# Patient Record
Sex: Female | Born: 1990 | Race: White | Hispanic: No | Marital: Married | Smoking: Former smoker
Health system: Southern US, Community
[De-identification: ages and names within clinical notes are randomized; demographics above are authoritative.]

## PROBLEM LIST (undated history)

## (undated) ENCOUNTER — Inpatient Hospital Stay: Payer: Self-pay

## (undated) DIAGNOSIS — E669 Obesity, unspecified: Secondary | ICD-10-CM

## (undated) DIAGNOSIS — E119 Type 2 diabetes mellitus without complications: Secondary | ICD-10-CM

## (undated) HISTORY — PX: CHOLECYSTECTOMY: SHX55

---
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
## 2011-03-05 ENCOUNTER — Emergency Department: Payer: Self-pay | Admitting: Internal Medicine

## 2012-01-27 ENCOUNTER — Emergency Department: Payer: Self-pay | Admitting: Emergency Medicine

## 2012-03-17 ENCOUNTER — Emergency Department: Payer: Self-pay | Admitting: Emergency Medicine

## 2012-03-17 LAB — CBC WITH DIFFERENTIAL/PLATELET
Eosinophil #: 0.1 10*3/uL (ref 0.0–0.7)
Eosinophil %: 2.4 %
Lymphocyte %: 33.1 %
Monocyte #: 0.5 x10 3/mm (ref 0.2–0.9)
Monocyte %: 11 %
Neutrophil #: 2.3 10*3/uL (ref 1.4–6.5)
RDW: 13.3 % (ref 11.5–14.5)

## 2012-03-17 LAB — COMPREHENSIVE METABOLIC PANEL
Albumin: 3.9 g/dL (ref 3.4–5.0)
Anion Gap: 7 (ref 7–16)
BUN: 10 mg/dL (ref 7–18)
Bilirubin,Total: 0.2 mg/dL (ref 0.2–1.0)
Calcium, Total: 8.7 mg/dL (ref 8.5–10.1)
Co2: 27 mmol/L (ref 21–32)
EGFR (Non-African Amer.): >60
Osmolality: 272 (ref 275–301)
Potassium: 3.3 mmol/L — ABNORMAL LOW (ref 3.5–5.1)
SGOT(AST): 41 U/L — ABNORMAL HIGH (ref 15–37)

## 2012-03-17 LAB — RAPID INFLUENZA A&B ANTIGENS

## 2012-03-17 LAB — URINALYSIS, COMPLETE
Bilirubin,UR: NEGATIVE
Nitrite: NEGATIVE
Ph: 5 (ref 4.5–8.0)
Protein: NEGATIVE
Specific Gravity: 1.029 (ref 1.003–1.030)
WBC UR: 1 /HPF (ref 0–5)

## 2012-03-17 LAB — LIPASE, BLOOD: Lipase: 84 U/L (ref 73–393)

## 2012-06-23 ENCOUNTER — Emergency Department (HOSPITAL_COMMUNITY): Payer: BC Managed Care – PPO

## 2012-06-23 ENCOUNTER — Emergency Department (HOSPITAL_COMMUNITY)
Admission: EM | Admit: 2012-06-23 | Discharge: 2012-06-24 | Disposition: A | Discharge status: Home / Self Care | Payer: BC Managed Care – PPO | Attending: Emergency Medicine | Admitting: Emergency Medicine

## 2012-06-23 ENCOUNTER — Encounter (HOSPITAL_COMMUNITY): Payer: Self-pay | Admitting: Emergency Medicine

## 2012-06-23 DIAGNOSIS — S6990XA Unspecified injury of unspecified wrist, hand and finger(s), initial encounter: Secondary | ICD-10-CM | POA: Insufficient documentation

## 2012-06-23 DIAGNOSIS — Y990 Civilian activity done for income or pay: Secondary | ICD-10-CM | POA: Insufficient documentation

## 2012-06-23 DIAGNOSIS — S60221A Contusion of right hand, initial encounter: Secondary | ICD-10-CM

## 2012-06-23 DIAGNOSIS — W208XXA Other cause of strike by thrown, projected or falling object, initial encounter: Secondary | ICD-10-CM | POA: Insufficient documentation

## 2012-06-23 DIAGNOSIS — Z87891 Personal history of nicotine dependence: Secondary | ICD-10-CM | POA: Insufficient documentation

## 2012-06-23 DIAGNOSIS — Y9289 Other specified places as the place of occurrence of the external cause: Secondary | ICD-10-CM | POA: Insufficient documentation

## 2012-06-23 DIAGNOSIS — Y9389 Activity, other specified: Secondary | ICD-10-CM | POA: Insufficient documentation

## 2012-06-23 DIAGNOSIS — S60229A Contusion of unspecified hand, initial encounter: Secondary | ICD-10-CM | POA: Insufficient documentation

## 2012-06-23 MED ORDER — OXYCODONE-ACETAMINOPHEN 5-325 MG PO TABS
2.0000 | ORAL_TABLET | Freq: Once | ORAL | Status: AC
  Administered 2012-06-24: 2 via ORAL
  Filled 2012-06-23: qty 2

## 2012-06-23 MED ORDER — NAPROXEN 375 MG PO TABS: 375.0000 mg | ORAL_TABLET | Freq: Two times a day (BID) | ORAL | Status: DC

## 2012-06-23 MED ORDER — HYDROCODONE-ACETAMINOPHEN 5-325 MG PO TABS: 1.0000 | ORAL_TABLET | Freq: Four times a day (QID) | ORAL | Status: DC | PRN

## 2012-06-23 NOTE — ED Notes (Signed)
PT. REPORTS INJURY TO RIGHT SHOULDER/RIGHT HAND  WHILE AT WORK ( [place])  THIS MORNING . SKIN INTACT , SWELLING / PAIN AT RIGHT HAND .

## 2012-06-23 NOTE — ED Notes (Signed)
Did not answer when called 

## 2012-06-23 NOTE — ED Provider Notes (Signed)
History     CSN: [id number]  Arrival date & time 06/23/12  2046   First MD Initiated Contact with Patient 06/23/12 2235      Chief Complaint  Patient presents with  . Shoulder Injury  . Hand Injury    (Consider location/radiation/quality/duration/timing/severity/associated sxs/prior treatment) HPI Comments: Patient is a 22 year old female in no significant past medical history who presents for right hand pain since this evening. Patient states that she was trying to lift a lawnmower when it landed on her right hand. Patient admits to swelling of her left hand as well as a throbbing pain sensation that radiates to her mid forearm. Pain is worse with movement and palpation and improved with rest. Patient denies fever, weakness, and numbness or tingling.  Patient is a 22 y.o. female presenting with shoulder injury and hand injury. The history is provided by the patient. No language interpreter was used.  Shoulder Injury Associated symptoms include arthralgias, joint swelling and myalgias.  Hand Injury   History reviewed. No pertinent past medical history.  History reviewed. No pertinent past surgical history.  No family history on file.  History  Substance Use Topics  . Smoking status: Former Games developer  . Smokeless tobacco: Not on file  . Alcohol Use: No    OB History   Grav Para Term Preterm Abortions TAB SAB Ect Mult Living                  Review of Systems  Musculoskeletal: Positive for myalgias, joint swelling and arthralgias.  All other systems reviewed and are negative.    Allergies  Cherry  Home Medications   Current Outpatient Rx  Name  Route  Sig  Dispense  Refill  . HYDROcodone-acetaminophen (NORCO/VICODIN) 5-325 MG per tablet   Oral   Take 1-2 tablets by mouth every 6 (six) hours as needed for pain.   15 tablet   0   . naproxen (NAPROSYN) 375 MG tablet   Oral   Take 1 tablet (375 mg total) by mouth 2 (two) times daily.   20 tablet   0     BP  130/74  Pulse 90  Temp(Src) 98.2 F (36.8 C) (Oral)  Resp 16  SpO2 98%  LMP 06/16/2012  Physical Exam  Nursing note and vitals reviewed. Constitutional: She is oriented to person, place, and time. She appears well-developed and well-nourished. No distress.  HENT:  Head: Normocephalic and atraumatic.  Eyes: Conjunctivae and EOM are normal. No scleral icterus.  Neck: Normal range of motion.  Cardiovascular: Normal rate, regular rhythm, normal heart sounds and intact distal pulses.   Pulmonary/Chest: Effort normal and breath sounds normal. No respiratory distress. She has no wheezes. She has no rales.  Musculoskeletal:       Right shoulder: She exhibits tenderness. She exhibits normal range of motion, no bony tenderness, no swelling, no crepitus, no deformity, no pain, normal pulse and normal strength.       Right hand: She exhibits tenderness, bony tenderness and swelling. She exhibits normal range of motion, normal two-point discrimination, normal capillary refill, no deformity and no laceration. Normal sensation noted. Normal strength noted.  TTP of 2nd-5th MCP and PIP joints of R hand. No bony deformities palpable. Capillary refill in RUE normal. DR pulses 2+ b/l.  Neurological: She is alert and oriented to person, place, and time.  No sensory or motor deficits appreciated  Skin: Skin is warm and dry. No rash noted. She is not diaphoretic. No erythema. No  pallor.  Soft tissue swelling and ecchymosis appreciated on the dorsal surface of the R hand  Psychiatric: She has a normal mood and affect. Her behavior is normal.    ED Course  Procedures (including critical care time)  Labs Reviewed - No data to display Dg Shoulder Right  06/23/2012  *RADIOLOGY REPORT*  Clinical Data: Shoulder injury.  The patient dislocated the right shoulder today but popped back in place.  Persistent pain. Dislocation is noted every 2-3 months.  RIGHT SHOULDER - 2+ VIEW  Comparison: None.  Findings: No  evidence of acute fracture or subluxation of the right shoulder.  Shallow Hill-Sachs deformity in the lateral humeral head.  No acute cortical step off is suggested.  Coracoclavicular and acromioclavicular spaces are maintained.  No focal bone lesion or bone destruction.  IMPRESSION: Probable Hill-Sachs deformity.  No acute fracture or subluxation demonstrated in the right shoulder.   Original Report Authenticated By: Burman Nieves, M.D.    Dg Hand Complete Right  06/23/2012  *RADIOLOGY REPORT*  Clinical Data: Pain, swelling, and bruising with decreased range of motion of the second and third digits.  RIGHT HAND - COMPLETE 3+ VIEW  Comparison: None.  Findings: Old fracture deformity versus chronic osteochondroma of the distal aspect proximal phalanx of the right second finger. Diffuse soft tissue swelling over the dorsum of the hand and fingers.  No evidence of acute fracture or subluxation.  No focal bone destruction or periosteal reaction.  No radiopaque soft tissue foreign bodies.  IMPRESSION: No acute bony abnormalities demonstrated.  Diffuse soft tissue swelling.   Original Report Authenticated By: Burman Nieves, M.D.      1. Contusion, hand, right, initial encounter      MDM  Contusion of R hand 2/2 lawn mower falling on hand at work. Patient neurovascularly intact with full ROM of RUE and hand. TTP of 2nd-5th MCP and PIP joints appreciated as well as muscle tenderness of R shoulder. Xray negative for bony fracture or dislocation. Probable Hill-Sachs deformity of R shoulder; discussed with patient. Patient stable for d/c with PCP follow up; RICE instruction provided as well as Naproxen for inflammation and Norco for severe pain. Indications for ED return discussed. Patient states comfort and understanding with this d/c plan with no unaddressed concerns.        Antony Madura, PA-C 06/27/12 [phone]

## 2012-06-24 NOTE — ED Notes (Signed)
Pt discharged.Vital signs stable and GCS 15 

## 2012-06-27 NOTE — ED Provider Notes (Signed)
Medical screening examination/treatment/procedure(s) were performed by non-physician practitioner and as supervising physician I was immediately available for consultation/collaboration.   Benny Lennert, MD 06/27/12 [phone]

## 2012-09-23 ENCOUNTER — Emergency Department: Payer: Self-pay | Admitting: Emergency Medicine

## 2012-09-28 ENCOUNTER — Ambulatory Visit: Payer: Self-pay | Admitting: Family Medicine

## 2013-07-06 IMAGING — CR DG CHEST 2V
1 series · 2 of 2 positions shown · non-contrast
Comparison: none

REASON FOR EXAM: cough
COMMENTS:

PROCEDURE:     DXR - DXR CHEST PA (OR AP) AND LATERAL  - March 17, 2012  [DATE]
RESULT:     The lungs are clear. The cardiac silhouette and visualized bony
skeleton are unremarkable.

[Series 1: w chest pa · 0.14mm/px · 2 of 2 slices shown]
[im 1/2]
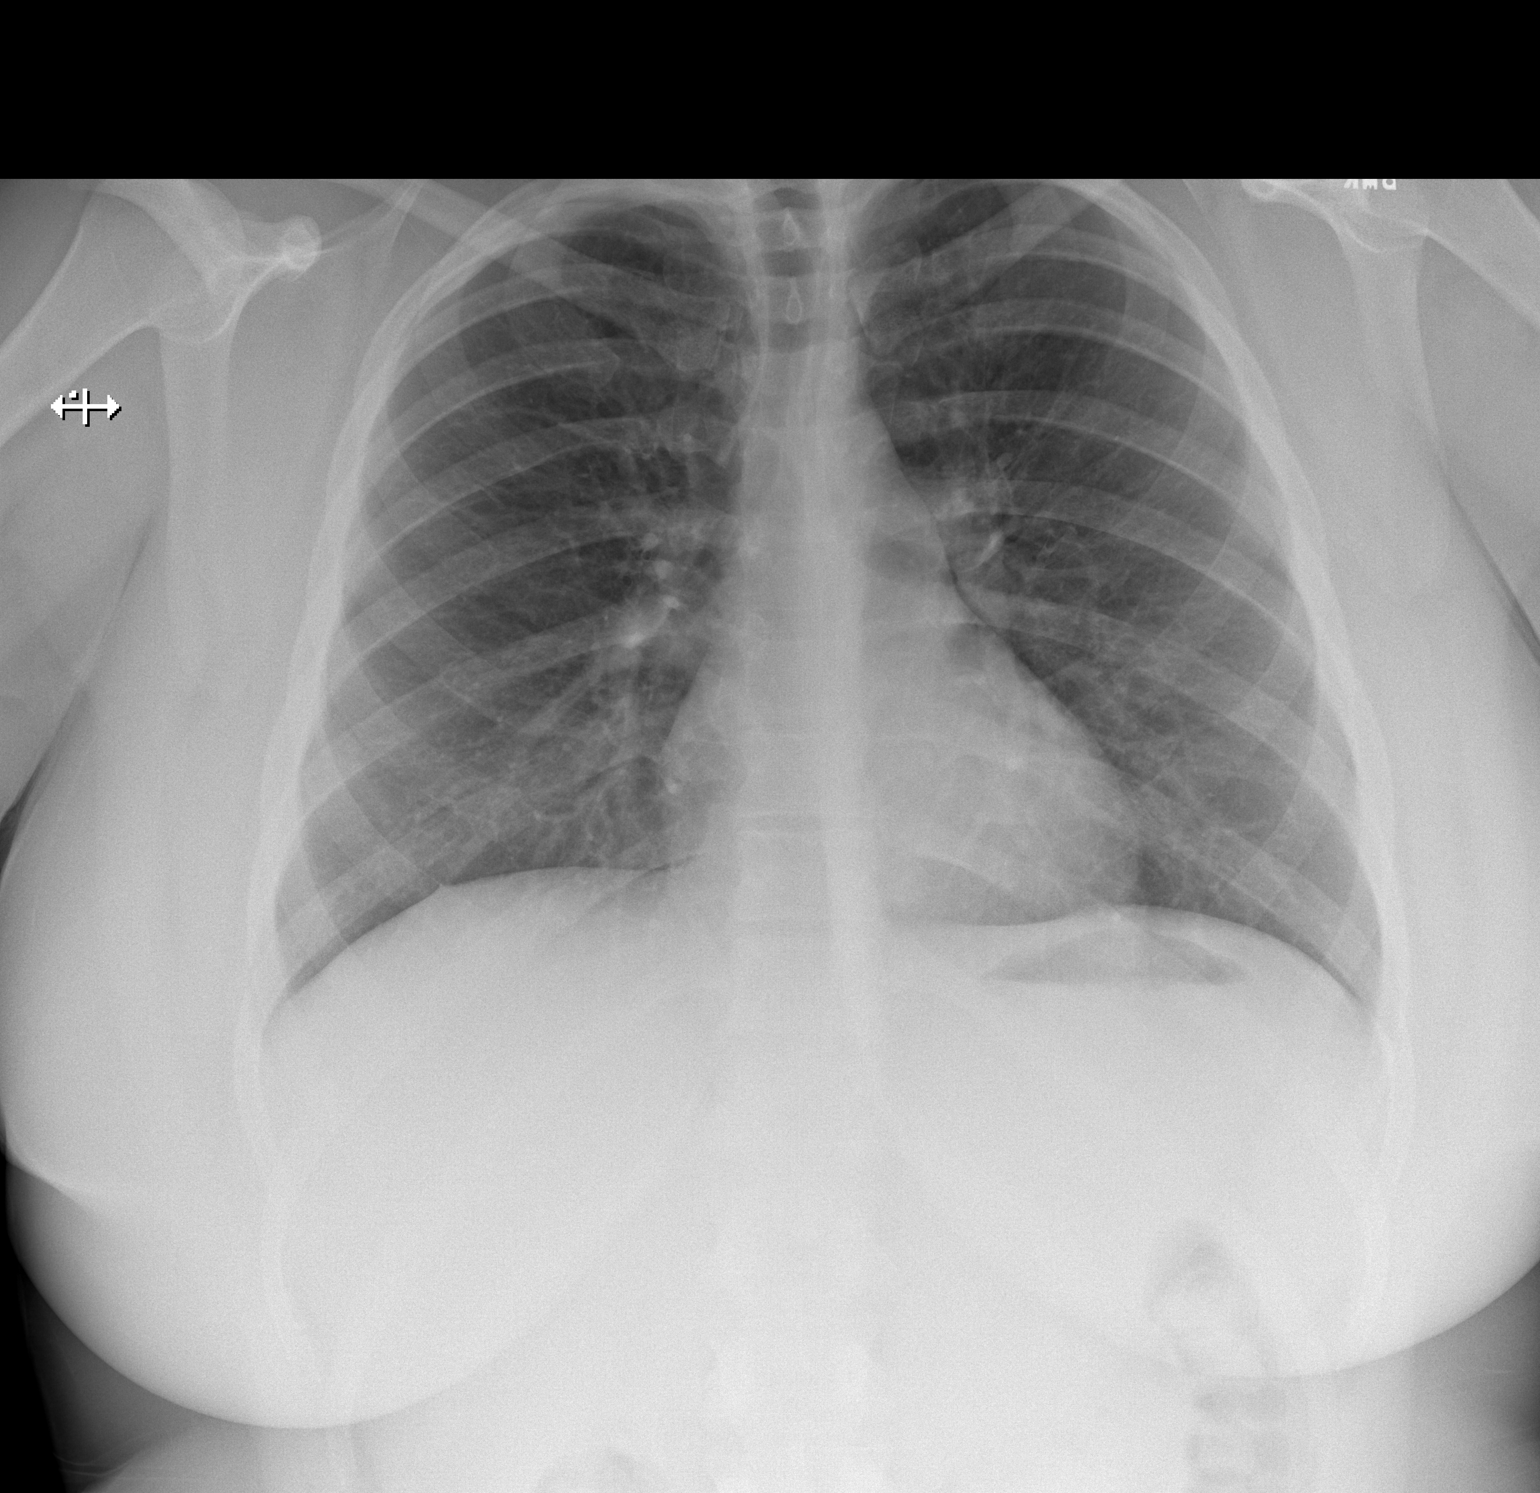
[im 2/2]
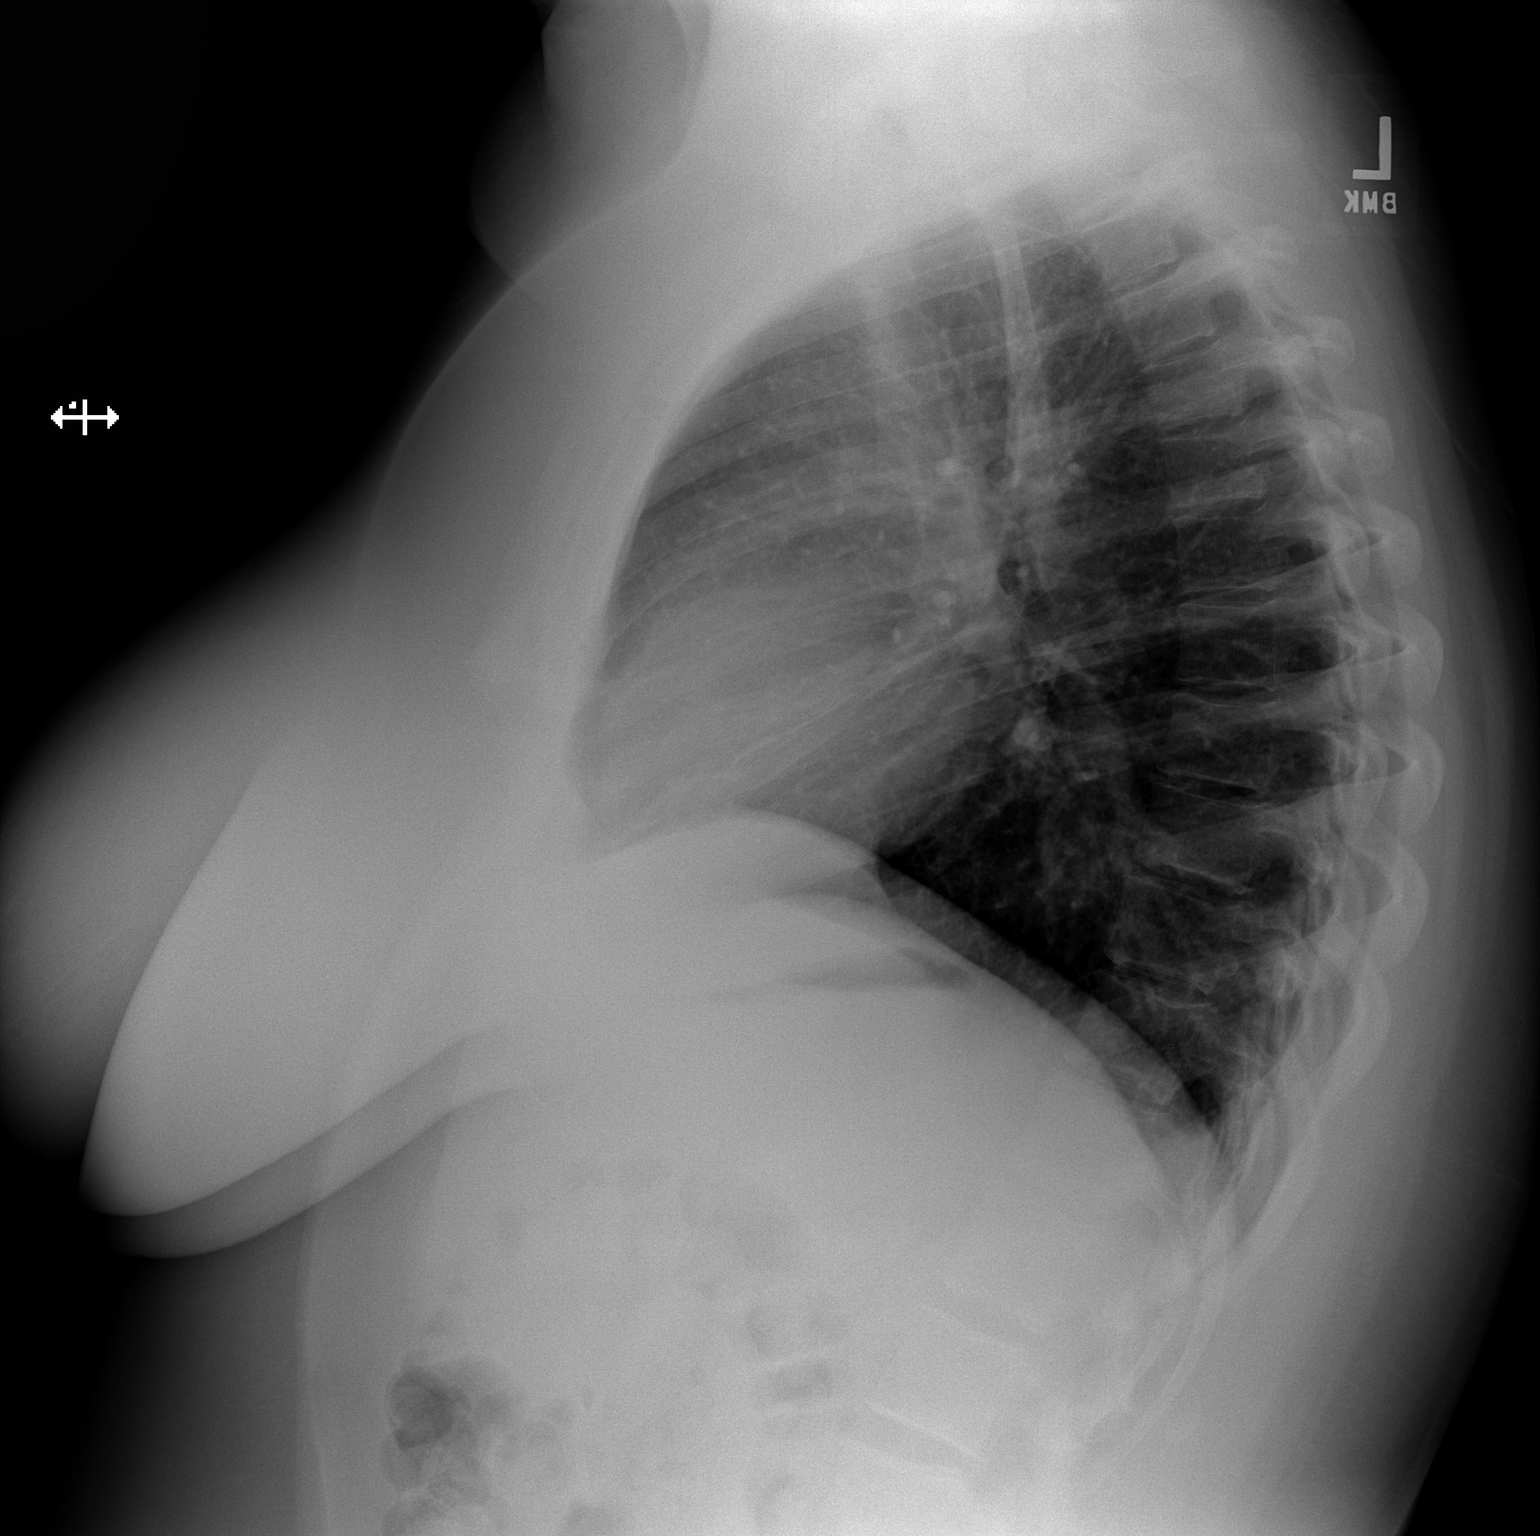

[2 of 2 positions shown; findings below may reference images not displayed]

IMPRESSION: 1. Chest radiograph without evidence of acute cardiopulmonary disease.

## 2014-10-12 ENCOUNTER — Encounter: Payer: Self-pay | Admitting: Emergency Medicine

## 2014-10-12 ENCOUNTER — Emergency Department
Admission: EM | Admit: 2014-10-12 | Discharge: 2014-10-12 | Disposition: A | Discharge status: Home / Self Care | Payer: Medicaid Other | Attending: Emergency Medicine | Admitting: Emergency Medicine

## 2014-10-12 ENCOUNTER — Emergency Department: Payer: Medicaid Other

## 2014-10-12 DIAGNOSIS — S299XXA Unspecified injury of thorax, initial encounter: Secondary | ICD-10-CM | POA: Insufficient documentation

## 2014-10-12 DIAGNOSIS — E119 Type 2 diabetes mellitus without complications: Secondary | ICD-10-CM | POA: Insufficient documentation

## 2014-10-12 DIAGNOSIS — Y998 Other external cause status: Secondary | ICD-10-CM | POA: Insufficient documentation

## 2014-10-12 DIAGNOSIS — F419 Anxiety disorder, unspecified: Secondary | ICD-10-CM | POA: Insufficient documentation

## 2014-10-12 DIAGNOSIS — Z87891 Personal history of nicotine dependence: Secondary | ICD-10-CM | POA: Insufficient documentation

## 2014-10-12 DIAGNOSIS — S27329A Contusion of lung, unspecified, initial encounter: Secondary | ICD-10-CM | POA: Insufficient documentation

## 2014-10-12 DIAGNOSIS — Y9389 Activity, other specified: Secondary | ICD-10-CM | POA: Insufficient documentation

## 2014-10-12 DIAGNOSIS — S3991XA Unspecified injury of abdomen, initial encounter: Secondary | ICD-10-CM | POA: Insufficient documentation

## 2014-10-12 DIAGNOSIS — Y92410 Unspecified street and highway as the place of occurrence of the external cause: Secondary | ICD-10-CM | POA: Insufficient documentation

## 2014-10-12 DIAGNOSIS — S0990XA Unspecified injury of head, initial encounter: Secondary | ICD-10-CM | POA: Insufficient documentation

## 2014-10-12 HISTORY — DX: Type 2 diabetes mellitus without complications: E11.9

## 2014-10-12 LAB — TYPE AND SCREEN
ABO/RH(D): A POS
Antibody Screen: NEGATIVE

## 2014-10-12 LAB — COMPREHENSIVE METABOLIC PANEL
ALT: 42 U/L (ref 14–54)
AST: 35 U/L (ref 15–41)
Albumin: 4.4 g/dL (ref 3.5–5.0)
Alkaline Phosphatase: 69 U/L (ref 38–126)
Anion gap: 9 (ref 5–15)
BUN: 12 mg/dL (ref 6–20)
CHLORIDE: 105 mmol/L (ref 101–111)
CO2: 23 mmol/L (ref 22–32)
CREATININE: 0.71 mg/dL (ref 0.44–1.00)
Calcium: 9.3 mg/dL (ref 8.9–10.3)
GFR calc Af Amer: >60 mL/min (ref >60)
GFR calc non Af Amer: >60 mL/min (ref >60)
Glucose, Bld: 129 mg/dL — ABNORMAL HIGH (ref 65–99)
Potassium: 3.8 mmol/L (ref 3.5–5.1)
SODIUM: 137 mmol/L (ref 135–145)
Total Bilirubin: <0.1 mg/dL — ABNORMAL LOW (ref 0.3–1.2)
Total Protein: 8.1 g/dL (ref 6.5–8.1)

## 2014-10-12 LAB — CBC
HEMATOCRIT: 40.4 % (ref 35.0–47.0)
HEMOGLOBIN: 13.2 g/dL (ref 12.0–16.0)
MCH: 28 pg (ref 26.0–34.0)
MCHC: 32.7 g/dL (ref 32.0–36.0)
MCV: 85.6 fL (ref 80.0–100.0)
Platelets: 415 10*3/uL (ref 150–440)
RBC: 4.72 MIL/uL (ref 3.80–5.20)
RDW: 13.3 % (ref 11.5–14.5)
WBC: 19.6 10*3/uL — AB (ref 3.6–11.0)

## 2014-10-12 LAB — ABO/RH: ABO/RH(D): A POS

## 2014-10-12 MED ORDER — IOHEXOL 350 MG/ML SOLN
100.0000 mL | Freq: Once | INTRAVENOUS | Status: AC | PRN
  Administered 2014-10-12: 100 mL via INTRAVENOUS

## 2014-10-12 MED ORDER — NAPROXEN 500 MG PO TABS: 500.0000 mg | ORAL_TABLET | Freq: Two times a day (BID) | ORAL | Status: DC
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
## 2014-11-05 ENCOUNTER — Ambulatory Visit: Payer: Medicaid Other

## 2016-04-11 DIAGNOSIS — E119 Type 2 diabetes mellitus without complications: Secondary | ICD-10-CM | POA: Insufficient documentation

## 2017-04-29 ENCOUNTER — Encounter: Payer: Self-pay | Admitting: Obstetrics and Gynecology

## 2018-12-19 ENCOUNTER — Other Ambulatory Visit: Payer: Self-pay

## 2018-12-19 DIAGNOSIS — Z87891 Personal history of nicotine dependence: Secondary | ICD-10-CM | POA: Insufficient documentation

## 2018-12-19 DIAGNOSIS — Z7984 Long term (current) use of oral hypoglycemic drugs: Secondary | ICD-10-CM | POA: Insufficient documentation

## 2018-12-19 DIAGNOSIS — E1165 Type 2 diabetes mellitus with hyperglycemia: Secondary | ICD-10-CM | POA: Insufficient documentation

## 2018-12-19 LAB — URINALYSIS, COMPLETE (UACMP) WITH MICROSCOPIC
Bacteria, UA: NONE SEEN
Bilirubin Urine: NEGATIVE
Glucose, UA: >=500 mg/dL — AB
Ketones, ur: NEGATIVE mg/dL
Leukocytes,Ua: NEGATIVE
Nitrite: NEGATIVE
Protein, ur: NEGATIVE mg/dL
Specific Gravity, Urine: 1.03 (ref 1.005–1.030)
pH: 6 (ref 5.0–8.0)

## 2018-12-19 LAB — BASIC METABOLIC PANEL
Anion gap: 11 (ref 5–15)
BUN: 17 mg/dL (ref 6–20)
CO2: 25 mmol/L (ref 22–32)
Calcium: 9.7 mg/dL (ref 8.9–10.3)
Chloride: 93 mmol/L — ABNORMAL LOW (ref 98–111)
Creatinine, Ser: 0.69 mg/dL (ref 0.44–1.00)
GFR calc Af Amer: >60 mL/min (ref >60)
GFR calc non Af Amer: >60 mL/min (ref >60)
Glucose, Bld: 547 mg/dL (ref 70–99)
Potassium: 3.9 mmol/L (ref 3.5–5.1)
Sodium: 129 mmol/L — ABNORMAL LOW (ref 135–145)

## 2018-12-19 LAB — CBC
HCT: 40.4 % (ref 36.0–46.0)
Hemoglobin: 13.6 g/dL (ref 12.0–15.0)
MCH: 28.5 pg (ref 26.0–34.0)
MCHC: 33.7 g/dL (ref 30.0–36.0)
MCV: 84.5 fL (ref 80.0–100.0)
Platelets: 335 10*3/uL (ref 150–400)
RBC: 4.78 MIL/uL (ref 3.87–5.11)
RDW: 12 % (ref 11.5–15.5)
WBC: 9.7 10*3/uL (ref 4.0–10.5)
nRBC: 0 % (ref 0.0–0.2)

## 2018-12-19 LAB — GLUCOSE, CAPILLARY: Glucose-Capillary: 579 mg/dL (ref 70–99)

## 2018-12-19 LAB — POCT PREGNANCY, URINE: Preg Test, Ur: NEGATIVE

## 2018-12-19 MED ORDER — SODIUM CHLORIDE 0.9 % IV SOLN
Freq: Once | INTRAVENOUS | Status: AC
  Administered 2018-12-19: 21:00:00 via INTRAVENOUS

## 2018-12-19 NOTE — ED Triage Notes (Signed)
Pt comes via POV from home with c/o high BS.  Pt states she felt dizzy earlier and almost fainted in the bathroom. Pt states her mom checked her BS and it read HIGH.  Pt states dx with Type 2 diabetes and took medication for a year. Pt states she then stopped taking it after being told she didn't have it.  Pt states she took 1 metformin from her mother.  Current BS-Reading HIGH

## 2018-12-20 ENCOUNTER — Emergency Department
Admission: EM | Admit: 2018-12-20 | Discharge: 2018-12-20 | Disposition: A | Discharge status: Home / Self Care | Payer: No Typology Code available for payment source | Attending: Emergency Medicine | Admitting: Emergency Medicine

## 2018-12-20 ENCOUNTER — Encounter: Payer: Self-pay | Admitting: Emergency Medicine

## 2018-12-20 DIAGNOSIS — E1165 Type 2 diabetes mellitus with hyperglycemia: Secondary | ICD-10-CM

## 2018-12-20 HISTORY — DX: Obesity, unspecified: E66.9

## 2018-12-20 LAB — GLUCOSE, CAPILLARY: Glucose-Capillary: 294 mg/dL — ABNORMAL HIGH (ref 70–99)

## 2018-12-20 LAB — HEMOGLOBIN A1C
Hgb A1c MFr Bld: 10.5 % — ABNORMAL HIGH (ref 4.8–5.6)
Mean Plasma Glucose: 254.65 mg/dL

## 2018-12-20 MED ORDER — ONDANSETRON HCL 4 MG PO TABS: 4.0000 mg | ORAL_TABLET | Freq: Three times a day (TID) | ORAL | 0 refills | Status: AC | PRN

## 2018-12-20 MED ORDER — METFORMIN HCL 1000 MG PO TABS: 1000.0000 mg | ORAL_TABLET | Freq: Two times a day (BID) | ORAL | 11 refills | Status: DC

## 2018-12-20 NOTE — Discharge Instructions (Signed)
As we discussed, though your blood sugar is running high, it is not dangerous at this time.  However I do think it is important you start back on medication.  I sent a blood test called a hemoglobin A1c which is a way of measuring the severity of your diabetes.  You can review the information in this paperwork about how to set up a MyChart account so that you can see the result and more importantly, you can share it with a primary care doctor.  [facility] to [facility] the next available appointment to establish care with a doctor if you do not have contact with your previous doctor.  Please take the medication as prescribed and try to avoid carbohydrates as much as possible.  Read through the included information for additional details about diabetes management.  Return to the emergency department if you develop new or worsening symptoms that concern you.

## 2018-12-20 NOTE — ED Notes (Signed)
Pt st that she was dx with Type 2 DM 3 years ago and was prescribed Metformin. Pt reports she was told a year ago that she no longer needed to take medication for her DM/;.Today pt reports being more confused today and her mother encouraged pt to check her CBG/ Pt st her CBG was >600 on her mothers machine and pt took 1 tab of Metformin 1000mg .   Pt st that over the course of the last month she's "felt like her sugar has been up and has had visual changes" but denies taking any medication or checking her CBG prior to today. Pt reports be under an increased amount of stress over the last month and has since "been eating a lot more junk food".   Pt also reports GI pain and upset for the last year that "makes her feel like her stomach's being stabbed"/ and it only happens after eating a large meal after 5pm /popcorn

## 2018-12-20 NOTE — ED Provider Notes (Signed)
[facility] Emergency Department Provider Note  ____________________________________________   First MD Initiated Contact with Patient 12/20/18 0112     (approximate)  I have reviewed the triage vital signs and the nursing notes.   HISTORY  Chief Complaint Hyperglycemia    HPI Kristin Evans is a 28 y.o. female with medical history as listed below who has been off of Metformin for about a year because she was told by her primary care doctor at the time that she no longer needed it and that she does not have diabetes.  She presents tonight for blood sugar reading too high to measure on home monitor.  She said that although she has been off of her medicine for about a year, it has been about a month that she has had gradually worsening symptoms, which include occasional dizziness, increased thirst, increased urination, etc.  She feels similar to the way she did in the past when she had to start taking Metformin which she says she took twice a day, 1000 mg with meals.  She has not had any contact with COVID-19 patients.  She says that she previously lost 115 pounds but is put back on 20 to 25 pounds.  She denies fever/chills, sore throat, chest pain, cough.  She has had occasional nausea and vomiting.  No diarrhea.  No vaginal complaints or concerns.  No dysuria nor hematuria.       Past Medical History:  Diagnosis Date  . Diabetes mellitus without complication ([place])   . Obesity     There are no active problems to display for this patient.   History reviewed. No pertinent surgical history.  Prior to Admission medications   Medication Sig Start Date End Date Taking? Authorizing Provider  metFORMIN (GLUCOPHAGE) 1000 MG tablet Take 1 tablet (1,000 mg total) by mouth 2 (two) times daily with a meal. 12/20/18 12/20/19  Hinda Kehr, MD  ondansetron (ZOFRAN) 4 MG tablet Take 1 tablet (4 mg total) by mouth every 8 (eight) hours as needed for nausea or  vomiting. 12/20/18 12/20/19  Hinda Kehr, MD    Allergies Cherry  History reviewed. No pertinent family history.  Social History Social History   Tobacco Use  . Smoking status: Former Research scientist (life sciences)  . Smokeless tobacco: Never Used  Substance Use Topics  . Alcohol use: No  . Drug use: No    Review of Systems Constitutional: No fever/chills.  Increased thirst.  Occasional dizziness. Eyes: No visual changes. ENT: No sore throat. Cardiovascular: Denies chest pain. Respiratory: Denies shortness of breath. Gastrointestinal: No abdominal pain.  Intermittent nausea and vomiting.  No diarrhea.  No constipation. Genitourinary: Negative for dysuria.  Increased urinary frequency. Musculoskeletal: Negative for neck pain.  Negative for back pain. Integumentary: Negative for rash. Neurological: Negative for headaches, focal weakness or numbness.   ____________________________________________   PHYSICAL EXAM:  VITAL SIGNS: ED Triage Vitals  Enc Vitals Group     BP 12/19/18 2109 (!) 142/84     Pulse Rate 12/19/18 2109 87     Resp 12/19/18 2109 18     Temp 12/19/18 2109 98.5 F (36.9 C)     Temp src --      SpO2 12/19/18 2109 100 %     Weight 12/19/18 2110 123.8 kg (273 lb)     Height 12/19/18 2110 1.6 m (5\' 3" )     Head Circumference --      Peak Flow --      Pain Score  12/19/18 2109 5     Pain Loc --      Pain Edu? --      Excl. in GC? --     Constitutional: Alert and oriented.  Well-appearing and in no acute distress. Eyes: Conjunctivae are normal.  Head: Atraumatic. Nose: No congestion/rhinnorhea. Mouth/Throat: Patient is wearing a mask. Neck: No stridor.  No meningeal signs.   Cardiovascular: Normal rate, regular rhythm. Good peripheral circulation. Grossly normal heart sounds. Respiratory: Normal respiratory effort.  No retractions. Gastrointestinal: Soft and nontender. No distention.  Musculoskeletal: No lower extremity tenderness nor edema. No gross deformities of  extremities. Neurologic:  Normal speech and language. No gross focal neurologic deficits are appreciated.  Skin:  Skin is warm, dry and intact. Psychiatric: Mood and affect are normal. Speech and behavior are normal.  ____________________________________________   LABS (all labs ordered are listed, but only abnormal results are displayed)  Labs Reviewed  GLUCOSE, CAPILLARY - Abnormal; Notable for the following components:      Result Value   Glucose-Capillary >600 (*)    All other components within normal limits  BASIC METABOLIC PANEL - Abnormal; Notable for the following components:   Sodium 129 (*)    Chloride 93 (*)    Glucose, Bld 547 (*)    All other components within normal limits  URINALYSIS, COMPLETE (UACMP) WITH MICROSCOPIC - Abnormal; Notable for the following components:   Color, Urine COLORLESS (*)    APPearance CLEAR (*)    Glucose, UA >=500 (*)    Hgb urine dipstick SMALL (*)    All other components within normal limits  GLUCOSE, CAPILLARY - Abnormal; Notable for the following components:   Glucose-Capillary 579 (*)    All other components within normal limits  GLUCOSE, CAPILLARY - Abnormal; Notable for the following components:   Glucose-Capillary 294 (*)    All other components within normal limits  CBC  HEMOGLOBIN A1C  CBG MONITORING, ED  POC URINE PREG, ED  POCT PREGNANCY, URINE  CBG MONITORING, ED   ____________________________________________  EKG  None - EKG not ordered by ED physician ____________________________________________  RADIOLOGY I, Loleta Rose, personally viewed and evaluated these images (plain radiographs) as part of my medical decision making, as well as reviewing the written report by the radiologist.  ED MD interpretation: No indication for emergent imaging  Official radiology report(s): No results found.  ____________________________________________   PROCEDURES   Procedure(s) performed (including Critical Care):   Procedures   ____________________________________________   INITIAL IMPRESSION / MDM / ASSESSMENT AND PLAN / ED COURSE  As part of my medical decision making, I reviewed the following data within the electronic MEDICAL RECORD NUMBER Nursing notes reviewed and incorporated, Labs reviewed , Old chart reviewed and Notes from prior ED visits   Differential diagnosis includes, but is not limited to, uncontrolled diabetes, DKA, HHNS, acute infection.  The patient's lab work is reassuring; other than an initial glucose of 547, and an associated pseudohyponatremia of 129, her lab work is reassuring with no evidence of acute infection and normal kidney function and a normal anion gap.  Since the patient does not currently have a primary care doctor I am sending a hemoglobin A1c and explained how she can check it in my chart.  [facility]  She took a leftover Metformin at home so we will not give her dose tonight but after we discussed the appropriate plan, I am starting her on Metformin 1000 mg twice  daily with meals and she says that she will [facility].  She knows she may have some gastrointestinal side effects and I have also written a prescription for Zofran and I gave her a [employer] coupon card.  I gave written information about diabetes and nutrition and encouraged her to avoid carbohydrates.  I gave my usual and customary return precautions.          ____________________________________________  FINAL CLINICAL IMPRESSION(S) / ED DIAGNOSES  Final diagnoses:  Type 2 diabetes mellitus with hyperglycemia, without long-term current use of insulin (HCC)     MEDICATIONS GIVEN DURING THIS VISIT:  Medications  0.9 %  sodium chloride infusion ( Intravenous Stopped 12/20/18 0111)     ED Discharge Orders         Ordered    metFORMIN (GLUCOPHAGE) 1000 MG tablet  2 times daily with meals     12/20/18 0148    ondansetron  (ZOFRAN) 4 MG tablet  Every 8 hours PRN     12/20/18 0148          *Please note:  Joesph JulyErica N Newell Kulik was evaluated in Emergency Department on 12/20/2018 for the symptoms described in the history of present illness. She was evaluated in the context of the global COVID-19 pandemic, which necessitated consideration that the patient might be at risk for infection with the SARS-CoV-2 virus that causes COVID-19. Institutional protocols and algorithms that pertain to the evaluation of patients at risk for COVID-19 are in a state of rapid change based on information released by regulatory bodies including the CDC and federal and state organizations. These policies and algorithms were followed during the patient's care in the ED.  Some ED evaluations and interventions may be delayed as a result of limited staffing during the pandemic.*  Note:  This document was prepared using Dragon voice recognition software and may include unintentional dictation errors.   Loleta RoseForbach, Matin Mattioli, MD 12/20/18 [phone]

## 2018-12-20 NOTE — ED Notes (Signed)
Pt unable to sign electronic signature due to computer issue. A hard copy was signed and placed for pickup by medical records.  Pt gathered all personal belongings from room and removed them prior to ED departure.

## 2018-12-22 LAB — GLUCOSE, CAPILLARY: Glucose-Capillary: >600 mg/dL (ref 70–99)

## 2019-01-23 ENCOUNTER — Other Ambulatory Visit: Payer: Self-pay

## 2019-01-23 DIAGNOSIS — Z20822 Contact with and (suspected) exposure to covid-19: Secondary | ICD-10-CM

## 2019-01-24 ENCOUNTER — Telehealth: Payer: Self-pay | Admitting: *Deleted

## 2019-01-24 NOTE — Telephone Encounter (Signed)
Pt called to check on her covid-19 test result. No result yet. Pt voiced understanding.

## 2019-01-25 LAB — NOVEL CORONAVIRUS, NAA: SARS-CoV-2, NAA: DETECTED — AB

## 2019-01-27 ENCOUNTER — Telehealth: Payer: Self-pay | Admitting: Nurse Practitioner

## 2019-01-27 NOTE — Telephone Encounter (Signed)
Called to Discuss with patient about Covid symptoms and the use of bamlanivimab, a monoclonal antibody infusion for those with mild to moderate Covid symptoms and at a high risk of hospitalization.     Pt is qualified for this infusion at the [facility] due to co-morbid conditions and/or a member of an at-risk group.    Patient is currently being managed for diabetes   Unable to reach pt  

## 2019-03-14 ENCOUNTER — Other Ambulatory Visit: Payer: Self-pay

## 2019-03-14 ENCOUNTER — Ambulatory Visit: Payer: Self-pay | Attending: Family

## 2019-03-14 DIAGNOSIS — Z20822 Contact with and (suspected) exposure to covid-19: Secondary | ICD-10-CM | POA: Insufficient documentation

## 2019-03-15 LAB — NOVEL CORONAVIRUS, NAA: SARS-CoV-2, NAA: NOT DETECTED

## 2019-08-30 ENCOUNTER — Telehealth: Payer: Self-pay | Admitting: General Practice

## 2019-08-30 NOTE — Telephone Encounter (Signed)
Individual has been contacted regarding the ED referral and has stated she is not interested in becoming a pt. No further attempts to contact individual will be made.

## 2019-11-08 DIAGNOSIS — O0993 Supervision of high risk pregnancy, unspecified, third trimester: Secondary | ICD-10-CM

## 2020-02-19 ENCOUNTER — Other Ambulatory Visit: Payer: Self-pay

## 2020-02-19 ENCOUNTER — Observation Stay
Admission: EM | Admit: 2020-02-19 | Discharge: 2020-02-19 | Disposition: A | Discharge status: Home / Self Care | Payer: Medicaid Other

## 2020-02-19 DIAGNOSIS — Z7984 Long term (current) use of oral hypoglycemic drugs: Secondary | ICD-10-CM | POA: Insufficient documentation

## 2020-02-19 DIAGNOSIS — O99512 Diseases of the respiratory system complicating pregnancy, second trimester: Principal | ICD-10-CM | POA: Insufficient documentation

## 2020-02-19 DIAGNOSIS — Z7982 Long term (current) use of aspirin: Secondary | ICD-10-CM | POA: Diagnosis not present

## 2020-02-19 DIAGNOSIS — E119 Type 2 diabetes mellitus without complications: Secondary | ICD-10-CM | POA: Diagnosis not present

## 2020-02-19 DIAGNOSIS — Z87891 Personal history of nicotine dependence: Secondary | ICD-10-CM | POA: Insufficient documentation

## 2020-02-19 DIAGNOSIS — J069 Acute upper respiratory infection, unspecified: Secondary | ICD-10-CM | POA: Diagnosis not present

## 2020-02-19 DIAGNOSIS — O368130 Decreased fetal movements, third trimester, not applicable or unspecified: Secondary | ICD-10-CM | POA: Diagnosis present

## 2020-02-19 DIAGNOSIS — O368120 Decreased fetal movements, second trimester, not applicable or unspecified: Secondary | ICD-10-CM | POA: Diagnosis present

## 2020-02-19 DIAGNOSIS — Z3A26 26 weeks gestation of pregnancy: Secondary | ICD-10-CM | POA: Diagnosis not present

## 2020-02-19 LAB — URINALYSIS, COMPLETE (UACMP) WITH MICROSCOPIC
Bilirubin Urine: NEGATIVE
Glucose, UA: >=500 mg/dL — AB
Hgb urine dipstick: NEGATIVE
Ketones, ur: NEGATIVE mg/dL
Leukocytes,Ua: NEGATIVE
Nitrite: NEGATIVE
Protein, ur: NEGATIVE mg/dL
Specific Gravity, Urine: 1.016 (ref 1.005–1.030)
pH: 7 (ref 5.0–8.0)

## 2020-02-19 MED ORDER — ACETAMINOPHEN 325 MG PO TABS: 650.0000 mg | ORAL_TABLET | ORAL | Status: AC | PRN

## 2020-02-19 MED ORDER — BENZONATATE 100 MG PO CAPS: 100.0000 mg | ORAL_CAPSULE | Freq: Three times a day (TID) | ORAL | 0 refills | Status: AC | PRN

## 2020-02-19 MED ORDER — AZITHROMYCIN 250 MG PO TABS: ORAL_TABLET | ORAL | 0 refills | Status: AC

## 2020-02-19 MED ORDER — CALCIUM CARBONATE ANTACID 500 MG PO CHEW: 2.0000 | CHEWABLE_TABLET | ORAL | Status: DC | PRN

## 2020-02-19 MED ORDER — GUAIFENESIN-DM 100-10 MG/5ML PO SYRP: 5.0000 mL | ORAL_SOLUTION | ORAL | 0 refills | Status: DC | PRN

## 2020-02-19 MED ORDER — ACETAMINOPHEN 325 MG PO TABS: 650.0000 mg | ORAL_TABLET | ORAL | Status: DC | PRN

## 2020-02-19 MED ORDER — ALBUTEROL SULFATE HFA 108 (90 BASE) MCG/ACT IN AERS: 2.0000 | INHALATION_SPRAY | Freq: Four times a day (QID) | RESPIRATORY_TRACT | 2 refills | Status: DC | PRN

## 2020-02-19 NOTE — OB Triage Note (Addendum)
Pt Kristin Evans 29 y.o. presents to the ED complaining of decreased fetal movement . Pt is a G1P0 at [redacted]w[redacted]d . Pt denies signs and symptons consistent with rupture of membranes or active vaginal bleeding. Pt denies contractions but reports lower abdominal cramping rating 6/10 on the pain scale. Pt also reports 8/10 constant headache that gets worse when she coughs. Pt reports cough, fever, chills, fatigue, body aches, nasal congestion, sore throat. Pt went today and was tested, resulted covid and flu negative.  External FM and TOCO applied to non-tender abdomen and assessing. Initial FHR 155 . Vital signs obtained and within normal limits. Provider notified of pt.

## 2020-02-19 NOTE — Progress Notes (Signed)
Pt discharged home per A.Mackie,CNM order.  Pt stable and ambulatory. An After Visit Summary was printed and given to the patient. Discharge education completed with patient/family including follow up instructions, medication list, d/c activities limitations if indicated, with other d/c instructions as indicated by CNM .Prescriptions sent to the pharmacy on file. Pt received labor and bleeding precautions. Patient able to verbalize understanding, all questions fully answered. Patient instructed to return to ED, call 911, or call MD for any changes in condition. Pt discharged home via personal vehicle with support person.

## 2020-02-19 NOTE — Discharge Summary (Signed)
Kristin Evans Record is a 29 y.o. female. She is at [redacted]w[redacted]d gestation. No LMP recorded. Patient is pregnant. Estimated Date of Delivery: 05/27/20  Prenatal care site: [facility]  Chief complaint: decreased fetal movement for 2 days   Kristin Evans presents today with complaints of decreased fetal movement for 2 days.  She reports that she's had worsening cough, congestion, headache, sinus pressure, fatigue, fever, chills, body aches, and wheezing for about a week.  States that fetal movement was decreased over the past 2 days since she's been feeling worse.  She was screened for covid and flu today and was negative.  Reports that her head hurts significantly when she starts a coughing fit.  Has tried robitussin and mucinex and they have not been helpful.    S: Resting in bed, intermittently coughing, no CTX, no VB.no LOF,  feeling baby move more now.    Maternal Medical History:  Past Medical Hx:  has a past medical history of Diabetes mellitus without complication (HCC) and Obesity.    Past Surgical Hx:  has no past surgical history on file.   No Active Allergies   Prior to Admission medications   Medication Sig Start Date End Date Taking? Authorizing Provider  albuterol (VENTOLIN HFA) 108 (90 Base) MCG/ACT inhaler Inhale 2 puffs into the lungs every 6 (six) hours as needed for wheezing or shortness of breath. 02/19/20  Yes Gustavo Lah, CNM  aspirin 81 MG chewable tablet Chew by mouth daily.   Yes [provider]  azithromycin (ZITHROMAX Z-PAK) 250 MG tablet Take 2 tablets (500 mg) on  Day 1,  followed by 1 tablet (250 mg) once daily on Days 2 through 5. 02/19/20 02/24/20 Yes Gustavo Lah, CNM  benzonatate (TESSALON PERLES) 100 MG capsule Take 1 capsule (100 mg total) by mouth 3 (three) times daily as needed for up to 10 days for cough. 02/19/20 02/29/20 Yes Gustavo Lah, CNM  guaiFENesin-dextromethorphan (ROBITUSSIN DM) 100-10 MG/5ML syrup Take 5 mLs by mouth every 4 (four)  hours as needed for cough. 02/19/20  Yes Gustavo Lah, CNM  Prenatal Vit-Fe Fumarate-FA (MULTIVITAMIN-PRENATAL) 27-0.8 MG TABS tablet Take 1 tablet by mouth daily at 12 noon.   Yes [provider]  acetaminophen (TYLENOL) 325 MG tablet Take 2 tablets (650 mg total) by mouth every 4 (four) hours as needed (for pain scale < 4  OR  temperature  >/=  100.5 F). 02/19/20   Gustavo Lah, CNM  metFORMIN (GLUCOPHAGE) 1000 MG tablet Take 1 tablet (1,000 mg total) by mouth 2 (two) times daily with a meal. 12/20/18 12/20/19  Loleta Rose, MD    Social History: She  reports that she has quit smoking. She has never used smokeless tobacco. She reports that she does not drink alcohol and does not use drugs.  Family History: No pertinent family history,no history of gyn cancers  Review of Systems: A full review of systems was performed and negative except as noted in the HPI.    O:  BP 129/65 (BP Location: Left Arm)   Pulse (!) 121   Temp 98.3 F (36.8 C) (Oral)   Resp 18   Ht 5\' 3"  (1.6 m)   Wt 115.4 kg   SpO2 95%   BMI 45.06 kg/m  Results for orders placed or performed during the hospital encounter of 02/19/20 (from the past 48 hour(s))  Urinalysis, Complete w Microscopic Urine, Clean Catch   Collection Time: 02/19/20  4:26 PM  Result Value Ref Range   Color, Urine YELLOW (A) YELLOW   APPearance HAZY (A) CLEAR   Specific Gravity, Urine 1.016 1.005 - 1.030   pH 7.0 5.0 - 8.0   Glucose, UA >=500 (A) NEGATIVE mg/dL   Hgb urine dipstick NEGATIVE NEGATIVE   Bilirubin Urine NEGATIVE NEGATIVE   Ketones, ur NEGATIVE NEGATIVE mg/dL   Protein, ur NEGATIVE NEGATIVE mg/dL   Nitrite NEGATIVE NEGATIVE   Leukocytes,Ua NEGATIVE NEGATIVE   RBC / HPF 0-5 0 - 5 RBC/hpf   WBC, UA 0-5 0 - 5 WBC/hpf   Bacteria, UA RARE (A) NONE SEEN   Squamous Epithelial / LPF 0-5 0 - 5   Mucus PRESENT      Constitutional: NAD, AAOx3  HE/ENT: extraocular movements grossly intact, moist mucous membranes CV:  RRR, equal, intermittent wheezing auscultated, rhonchi present - clears with cough  PULM: nl respiratory effort, CTABL     Abd: gravid, non-tender, non-distended, soft      Ext: Non-tender, Nonedmeatous   Psych: mood appropriate, speech normal Pelvic : deferred  Fetal Monitor: Baseline: 150 bpm, Variability: moderate, Accelerations: >2 10x10 and Decelerations: Absent  Toco: quiet   Category: 1 Appropriate for gestational age    Assessment: 29 y.o. [redacted]w[redacted]d here for antenatal surveillance during pregnancy.  Principle diagnosis: Acute upper respiratory infection in pregnancy   Plan:  Labor: not present.   Fetal Wellbeing: Reassuring Cat 1 tracing.  Reactive NST - appropriate for gestational age   Intermittent wheezing and rhonchi present   Discussed that most respiratory illnesses are viral.  However, given that she is pregnant and has diabetes will treat with azithromycin d/t worsening symptoms over the past week.    Rx for albuterol inhaler given, reviewed appropriate use   Rx for benzonatate for cough suppressant - Robitussin and mucinex have not been effective   Comfort measures reviewed.  Encouraged hydration.    Warning signs reviewed.  Instructed to return for worsening symptoms.    D/c home stable, precautions reviewed, follow-up as scheduled.   ----- Margaretmary Eddy, CNM Certified Nurse Midwife [facility]

## 2020-02-19 NOTE — Discharge Instructions (Signed)
May take sudafed as needed for congestion Robitussin DM as needed for cough and congestion  Be sure to drink plenty of fluids    Acute Bronchitis, Adult  Acute bronchitis is when air tubes in the lungs (bronchi) suddenly get swollen. The condition can make it hard for you to breathe. In adults, acute bronchitis usually goes away within 2 weeks. A cough caused by bronchitis may last up to 3 weeks. Smoking, allergies, and asthma can make the condition worse. What are the causes? This condition is caused by:  Cold and flu viruses. The most common cause of this condition is the virus that causes the common cold.  Bacteria.  Substances that irritate the lungs, including: ? Smoke from cigarettes and other types of tobacco. ? Dust and pollen. ? Fumes from chemicals, gases, or burned fuel. ? Other materials that pollute indoor or outdoor air.  Close contact with someone who has acute bronchitis. What increases the risk? The following factors may make you more likely to develop this condition:  A weak body's defense system. This is also called the immune system.  Any condition that affects your lungs and breathing, such as asthma. What are the signs or symptoms? Symptoms of this condition include:  A cough.  Coughing up clear, yellow, or green mucus.  Wheezing.  Chest congestion.  Shortness of breath.  A fever.  Body aches.  Chills.  A sore throat. How is this treated? Acute bronchitis may go away over time without treatment. Your doctor may recommend:  Drinking more fluids.  Taking a medicine for a fever or cough.  Using a device that gets medicine into your lungs (inhaler).  Using a vaporizer or a humidifier. These are machines that add water or moisture in the air to help with coughing and poor breathing. Follow these instructions at home:  Activity  Get a lot of rest.  Avoid places where there are fumes from chemicals.  Return to your normal activities as  told by your doctor. Ask your doctor what activities are safe for you. Lifestyle  Drink enough fluids to keep your pee (urine) pale yellow.  Do not drink alcohol.  Do not use any products that contain nicotine or tobacco, such as cigarettes, e-cigarettes, and chewing tobacco. If you need help quitting, ask your doctor. Be aware that: ? Your bronchitis will get worse if you smoke or breathe in other people's smoke (secondhand smoke). ? Your lungs will heal faster if you quit smoking. General instructions  Take over-the-counter and prescription medicines only as told by your doctor.  Use an inhaler, cool mist vaporizer, or humidifier as told by your doctor.  Rinse your mouth often with salt water. To make salt water, dissolve -1 tsp (3-6 g) of salt in 1 cup (237 mL) of warm water.  Keep all follow-up visits as told by your doctor. This is important. How is this prevented? To lower your risk of getting this condition again:  Wash your hands often with soap and water. If soap and water are not available, use hand sanitizer.  Avoid contact with people who have cold symptoms.  Try not to touch your mouth, nose, or eyes with your hands.  Make sure to get the flu shot every year. Contact a doctor if:  Your symptoms do not get better in 2 weeks.  You vomit more than once or twice.  You have symptoms of loss of fluid from your body (dehydration). These include: ? Dark urine. ? Dry skin  or eyes. ? Increased thirst. ? Headaches. ? Confusion. ? Muscle cramps. Get help right away if:  You cough up blood.  You have chest pain.  You have very bad shortness of breath.  You become dehydrated.  You faint or keep feeling like you are going to faint.  You keep vomiting.  You have a very bad headache.  Your fever or chills get worse. These symptoms may be an emergency. Do not wait to see if the symptoms will go away. Get medical help right away. Call your local emergency  services (911 in the [place] Do not drive yourself to the hospital. Summary  Acute bronchitis is when air tubes in the lungs (bronchi) suddenly get swollen. In adults, acute bronchitis usually goes away within 2 weeks.  Take over-the-counter and prescription medicines only as told by your doctor.  Drink enough fluid to keep your pee (urine) pale yellow.  Contact a doctor if your symptoms do not improve after 2 weeks of treatment.  Get help right away if you cough up blood, faint, or have chest pain or shortness of breath. This information is not intended to replace advice given to you by your health care provider. Make sure you discuss any questions you have with your health care provider. Document Revised: 09/02/2018 Document Reviewed: 09/02/2018 Elsevier Patient Education  2020 [employer].

## 2020-02-24 NOTE — L&D Delivery Note (Signed)
Delivery Note  First Stage: Labor onset: 0930 Induction: Cytotec x 2, AROM Analgesia /Anesthesia intrapartum: fentanyl x 1, Epidural AROM at 1013  Second Stage: Complete dilation at 1136 Onset of pushing at 1155 FHR second stage Cat II, 140bpm, moderate variability, Variable decels with pushing.    Delivery of a viable female infant on 05/07/20 at 1226 by CNM delivery of fetal head in ROA position with restitution to ROT.  No nuchal cord;  Anterior then posterior shoulders delivered easily with gentle downward traction and maternal positioning with HOB down. Baby placed on mom's chest, and attended to by peds.  Cord double clamped after cessation of pulsation, cut by FOB  Third Stage: Placenta delivered spontaneously intact with 3VC @ 1229 Placenta disposition: routine disposal Uterine tone Firm / bleeding small  Right sidewall and 2nd deg perineal laceration identified  Anesthesia for repair: epidural Repair 2-0 Vicryl CT-1 Est. Blood Loss (mL):  Complications: none  Mom to postpartum.  Baby to Couplet care / Skin to Skin.  Newborn: Birth Weight: pending  Apgar Scores: 8/9 Feeding planned: breast

## 2020-04-11 ENCOUNTER — Observation Stay
Admission: EM | Admit: 2020-04-11 | Discharge: 2020-04-11 | Disposition: A | Discharge status: Home / Self Care | Payer: Medicaid Other | Attending: Obstetrics and Gynecology | Admitting: Obstetrics and Gynecology

## 2020-04-11 ENCOUNTER — Encounter: Payer: Self-pay | Admitting: Obstetrics and Gynecology

## 2020-04-11 ENCOUNTER — Other Ambulatory Visit: Payer: Self-pay

## 2020-04-11 DIAGNOSIS — Z3A33 33 weeks gestation of pregnancy: Secondary | ICD-10-CM | POA: Insufficient documentation

## 2020-04-11 DIAGNOSIS — Z79899 Other long term (current) drug therapy: Secondary | ICD-10-CM | POA: Insufficient documentation

## 2020-04-11 DIAGNOSIS — O26853 Spotting complicating pregnancy, third trimester: Principal | ICD-10-CM | POA: Insufficient documentation

## 2020-04-11 DIAGNOSIS — O24415 Gestational diabetes mellitus in pregnancy, controlled by oral hypoglycemic drugs: Secondary | ICD-10-CM | POA: Insufficient documentation

## 2020-04-11 DIAGNOSIS — Z794 Long term (current) use of insulin: Secondary | ICD-10-CM | POA: Insufficient documentation

## 2020-04-11 DIAGNOSIS — Z7982 Long term (current) use of aspirin: Secondary | ICD-10-CM | POA: Diagnosis not present

## 2020-04-11 DIAGNOSIS — Z87891 Personal history of nicotine dependence: Secondary | ICD-10-CM | POA: Diagnosis not present

## 2020-04-11 DIAGNOSIS — Z7984 Long term (current) use of oral hypoglycemic drugs: Secondary | ICD-10-CM | POA: Diagnosis not present

## 2020-04-11 LAB — GLUCOSE, CAPILLARY: Glucose-Capillary: 87 mg/dL (ref 70–99)

## 2020-04-11 NOTE — Discharge Instructions (Signed)
Call provider or return to birthplace with:  1. Strong regular contractions every 5 minutes. 2. Leaking of fluid from your vagina 3. Vaginal bleeding: Bright red or heavy like a period 4. Decreased Fetal movement   Gestational Diabetes Diet  Eat a Healthy, Balanced Diet Eating a healthy diet is an important part of a treatment plan for gestational diabetes. A healthy diet includes a balance of foods from all the food groups, giving you the nutrients, vitamins, and minerals you need for a healthy pregnancy. For women with gestational diabetes, eating a balanced diet also helps to keep blood sugar levels in the healthy target range. Following a meal plan and eating a healthy diet is a key part of managing gestational diabetes.  It is essential that you work with your health care provider to create a plan for your healthy diet. The information in this booklet is for women who have been diagnosed with gestational diabetes. These guidelines are not appropriate for all pregnant women.  Carbohydrates Carbohydrates are part of a healthy diet for a woman with gestational diabetes. Carbohydrates are nutrients that come from certain foods, like grains, milk and yogurt, fruits, and starchy vegetables. During digestion, your body breaks down most carbohydrates into simple sugars, which is your body's main source of energy. Eating carbohydrates increases your blood sugar level. If you eat a small amount of carbohydrate at a meal, your blood sugar level goes up a small amount. If you eat a large amount of carbohydrate at a meal, your blood sugar level goes up a large amount. You need to find a balance between eating enough carbohydrates to get the energy and glucose you need, and limiting the carbohydrates you eat to control your blood sugar level.  The best way to do this is to spread them throughout the day. Your health care team will come up with a healthy diet for you that includes the right amount of  carbohydrates to maintain a healthy pregnancy. Women with gestational diabetes usually need to avoid foods that are high in sugar, like sweets and desserts, in order to keep their blood sugar level in control. Not getting enough carbohydrates can also cause problems. You should follow the meal plan provided by your health care provider. Balancing your diet All foods contain some combination of carbohydrate, fat, and protein. Fat and protein affect your blood glucose over many hours, but carbohydrate affects it much faster. For this reason, you will need to regulate your intake of foods that are rich in carbohydrate ("carbs"). Your healthcare provider will show you how and your meal plan will help you stay on track. It is important to make healthy food choices. Nutritious foods support your baby's growth and development, help control your gestational diabetes, and keep you feeling well. Controlling your gestational diabetes requires controlling the pattern of your eating. Your meal plan gives you targets for when to eat and how much to eat. Steps to get started Begin Counting Carbohydrates.  To manage your blood sugar you will learn a technique called "carbohydrate ("carb") counting".  This system helps you balance your meals and snacks throughout the day.  Begin by reading the Nutrition Facts labels for "Total Carbohydrates".  Your target for will likely be 30-45 grams for meals and 15-30 grams for snacks. Details about Carbohydrate Counting. Eat smaller amounts of carbohydrates at each meal. Rather than eating a large amount of carbohydrate at a single meal, spread out your carbohydrates throughout the day. Eating carbohydrates directly affects your  blood sugar level, so eating a smaller amount of carbohydrate at regular intervals through the day will help keep your blood sugar from rising too high after a meal Eat small, frequent meals and snacks. Eat about every 2 to 3 hours. Because you are eating  fewer carbohydrates at your meals, you will needs to eat more frequently in order to meet your daily nutritional needs.  Plan at least 3 meals and 3 snacks a day. Include protein at meals and snacks. You protein needs increase during your last trimester.  Protein may help even out your blood glucose. It may also help you feel more satisfied throughout the day. Eat a very small breakfast, with a similar mid-morning snack about 2 hours later. Blood glucose levels tends to be higher in the morning. To offset this, your meal plan will probably include fewer carbs at breakfast than at lunch or dinner.  Have a nighttime snack. It is good to eat a snack before you go to sleep to keep your blood sugar at a healthy level overnight. Some examples of healthy snacks include: a [place] yogurt, an apple with peanut butter or whole grain crackers with cheese.Choose high-fiber foods. Good sources include whole-grain breads and cereals, fresh and frozen vegetables, and beans. Fruits can also a good source of fiber -- most plans include fruit in afternoon or evening meals and snacks. Watch out for sugar and concentrated sweets. Do not drink fruit juice.  Plan to get your fruit servings later in the day (not at breakfast). Although fruits are a healthy source of carbohydrate, their carbs are easily absorbed and tend to raise blood glucose levels quickly. Avoid regular soft drinks, fruit juice and fruit drinks. High-carbohydrate drinks like these raise your blood glucose quickly. Limit desserts such as ice cream, pies, cakes, and cookies. These foods often have large amounts of added sugar, honey, or other sweeteners. Read labels carefully and check them for total carbohydrates per serving. Be careful about fat Consume lean protein foods, such as poultry and fish. Avoid high fat meats, lunch meat, bacon, sausage, and hot dogs. Remove all visible fat by removing the skin of poultry and trimming fat from meat. Bake, broil,  steam, boil, or grill foods. Avoid frying. If you do fry foods, use nonstick pans, vegetable oil spray, or small amounts (1 to 2 teaspoons) of oil. Use skim or low-fat (1%) milk and dairy products. Limit or avoid adding extra fat, such as butter, margarine, sour cream, mayonnaise, avocados, cream, cream cheese, salad dressing, or nuts. Limit convenience foods. These are often higher in carbohydrate, fat, and sodium. Avoid instant noodles, canned soup, instant potatoes, frozen meals, and packaged foods.

## 2020-04-11 NOTE — OB Triage Note (Signed)
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 Pt reports cramping after occurrence and noticed a pea sized pink discharge when she wiped. Pt states that she has been cramping for a few days and has noticed increased swelling this last week. Pt denies contractions but states underwear felt wet when she just removed them upon arrival to hospital. Pt has Type 2 DM, and has been working with provider to control glucose levels and started insulin 4 days ago, with no improvement in fast or HS blood sugars. Pt had a f/u appt today but had to miss it for work. EFM and toco applied and explaine. Plan to monitor fetal and maternal well being and ass pt complaints.

## 2020-04-11 NOTE — Plan of Care (Signed)
RD consulted for nutrition education regarding diet for GDM.  Lab Results  Component Value Date   HGBA1C 10.5 (H) 12/19/2018    30 year old female 53w3dgestation admitted for antenatal monitoring after vaginal spotting. Patient with DM type 2 prior to pregnancy.  Met with patient and significant other at bedside. Patient reports she has good appetite and intake at baseline. She reports she has not previously received formal education on carbohydrate modified diet. She reports typically eating 2 meals per day. For breakfast she has yogurt with a protein shake. For dinner she usually has pasta or meat with sides. She occasionally snacks during the day at work but it is very busy. She drinks water during the day. Patient reports she is unsure how much longer she can work during pregnancy. She reports she can try to eat 3 meals per day. Discussed importance of consistent carbohydrate intake. Patient very engaged during education.  RD provided "Gestational Diabetes Nutrition Therapy" handout from the Academy of [employer] and Dietetics. Discussed different food groups and their effects on blood sugar, emphasizing carbohydrate-containing foods. Provided list of carbohydrates and recommended serving sizes of common foods.  Discussed importance of controlled and consistent carbohydrate intake throughout the day. Provided examples of ways to balance meals/snacks and encouraged intake of high-fiber, whole grain complex carbohydrates. Teach back method used.  Recommended Nutrition Prescription for GDM: Breakfast: 30-35 grams carbohydrates Morning Snack: 30 grams carbohydrates Lunch: 45-50 grams carbohydrates Afternoon Snack: 30 grams carbohydrates Dinner: 45-50 grams of carbohydrates Evening Snack: 30 grams of carbohydrates A minimum of 175 grams of carbohydrates is needed daily in pregnancy. Also discussed that the lower carbohydrate intake (30-35 grams) is suggested at breakfast because carbohydrate is  generally less well tolerated at breakfast than at other meals during pregnancy.  Expect good compliance.  Current diet order is gestational carbohydrate modified. Labs and medications reviewed. No further nutrition interventions warranted at this time. RD contact information provided. If additional nutrition issues arise, please re-consult RD.  LJacklynn Barnacle MS, RD, LDN Pager number available on Amion

## 2020-04-11 NOTE — Progress Notes (Addendum)
ADA Standards of Care 2019 Diabetes in Pregnancy Target Glucose Ranges:  Fasting: 60 - 90 mg/dL Preprandial: 60 - 105 mg/dL 1 hr postprandial: Less than 159m/dL (from first bite of meal) 2 hr postprandial: Less than 120 mg/dL (from first bit of meal)  Results for NTANITA, PALINKAS(MRN [id number] as of 04/11/2020 13:10  Ref. Range 04/11/2020 12:36  Glucose-Capillary Latest Ref Range: 70 - 99 mg/dL 87    Admit with: 387w3dere for antenatal surveillance during pregnancy  History: Pre-Existing Diabetes (diagnosed [age]ears old)  Home DM Meds: Levemir 65 units Daily       Metformin 1000 mg BID  Current Orders: Plan for Levemir and Regular Insulins per MD notes Levemir 70 units QHS Regular 8 units AM/ 6 units PM    Seen by Dr. WaLeonides Schanzor [facility]  Met with pt at bedside this afternoon.  Reviewed the following with pt: Diabetes basics during pregnancy Importance of CBG control for mom and baby CBG goals (fasting and 2-hour postprandial) Basic Carbohydrate Counting for GDM--Have ordered STAT RD consult for GDM carbohydrate counting, carbohydrate needs during remained of pregnancy--Pt states she knows how to read food label--we reviewed importance of knowing serving size and Total Carbohydrates to be able to help count carbs Signs and Symptoms of Hypoglycemia and Treatment of Hypoglycemia How to Use Insulin Pen at home (demo'ed pen with pt and SO--SO giving insulin and states he can continue to give at home)--Reminded pt and SO to use fatty areas on Stomach and Upper arm--DO not inject into muscle--Can pinch fatty area if concerned about injecting into muscle Storage of insulin--Rotation of injection sites Also reviewed Levemir Insulin and Meal time insulin--when to give, how to give, doses, etc. Strongly encouraged pt and SO to slow down and make sure they have the right insulin, for the right time, and the right dose    --Will follow  patient during hospitalization--  JeWyn QuakerN, MSN, CDE Diabetes Coordinator Inpatient Glycemic Control Team Team Pager: [phone])

## 2020-04-11 NOTE — Discharge Summary (Signed)
Subjective   Kristin Evans is a 30 y.o. female. She is at [redacted]w[redacted]d gestation. No LMP recorded. Patient is pregnant. 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  A2GDM who in on Levemir , gave her self insulin IM instead of sq.    S: Resting comfortably. noCTX, no VB.no LOF, Active fetal movement.  Maternal Medical History:       Past Medical History:  Diagnosis Date  . Diabetes mellitus without complication (HCC)   . Obesity     History reviewed. No pertinent surgical history.  No Known Allergies         Prior to Admission medications   Medication Sig Start Date End Date Taking? Authorizing Provider  acetaminophen (TYLENOL) 325 MG tablet Take 2 tablets (650 mg total) by mouth every 4 (four) hours as needed (for pain scale < 4  OR  temperature  >/=  100.5 F). 02/19/20  Yes Gustavo Lah, CNM  aspirin 81 MG chewable tablet Chew by mouth daily.   Yes [provider]  insulin detemir (LEVEMIR) 100 UNIT/ML injection Inject 65 Units into the skin daily. Before bed   Yes [provider]  metFORMIN (GLUCOPHAGE) 1000 MG tablet Take 1 tablet (1,000 mg total) by mouth 2 (two) times daily with a meal. 12/20/18 12/20/19 Yes Loleta Rose, MD  Prenatal Vit-Fe Fumarate-FA (MULTIVITAMIN-PRENATAL) 27-0.8 MG TABS tablet Take 1 tablet by mouth daily at 12 noon.   Yes [provider]  albuterol (VENTOLIN HFA) 108 (90 Base) MCG/ACT inhaler Inhale 2 puffs into the lungs every 6 (six) hours as needed for wheezing or shortness of breath. Patient not taking: Reported on 04/11/2020 02/19/20   Gustavo Lah, CNM  guaiFENesin-dextromethorphan [facility] DM) 100-10 MG/5ML syrup Take 5 mLs by mouth every 4 (four) hours as needed for cough. Patient not taking: Reported on 04/11/2020 02/19/20   Gustavo Lah, CNM     Social History: She   reports that she has quit smoking. She has never used smokeless tobacco. She reports that she does not drink alcohol and does not use drugs.  Family History: family history is not on file.  no history of gyn cancers  Review of Systems: A full review of systems was performed and negative except as noted in the HPI.     O:   Objective   BP 123/73   Pulse (!) 112   Temp 98.7 F (37.1 C) (Oral)   Resp 18       Results for orders placed or performed during the hospital encounter of 04/11/20 (from the past 48 hour(s))  Glucose, capillary   Collection Time: 04/11/20 12:36 PM  Result Value Ref Range   Glucose-Capillary 87 70 - 99 mg/dL     Constitutional: NAD, AAOx3  HE/ENT: extraocular movements grossly intact, moist mucous membranes CV: RRR PULM: nl respiratory effort, CTABL                                         Abd: gravid, non-tender, non-distended, soft  Ext: Non-tender, Nonedmeatous                     Psych: mood appropriate, speech normal  NST:  Baseline:120  Variability: moderate Accelerations present x >2 Decelerations absent Time    Assessment: 30 y.o. [redacted]w[redacted]d here for antenatal surveillance during pregnancy.  Principle diagnosis: vaginal spotting  A2GDM  CAt 1 fetal strip  Fasting sugars and PP values all elevated .  Plan: Diabetic coordinator to see and instruct on insulin dosing   dietician to counsel her on ADA diet  Levemir 70 unit q hs and reg 8 units in am 6 units in pm  ----- Beverly Gust MD Attending Obstetrician and Gynecologist [facility], Department of OB/GYN [facility]         Electronically signed by Illias Pantano, Ihor Austin, MD at 04/11/18

## 2020-04-11 NOTE — Progress Notes (Signed)
Kristin Evans is a 30 y.o. female. She is at [redacted]w[redacted]d gestation. No LMP recorded. Patient is pregnant. 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  A2GDM who in on Levemir , gave her self insulin IM instead of sq.    S: Resting comfortably. no CTX, no VB.no LOF,  Active fetal movement.  Maternal Medical History:   Past Medical History:  Diagnosis Date  . Diabetes mellitus without complication (HCC)   . Obesity     History reviewed. No pertinent surgical history.  No Known Allergies  Prior to Admission medications   Medication Sig Start Date End Date Taking? Authorizing Provider  acetaminophen (TYLENOL) 325 MG tablet Take 2 tablets (650 mg total) by mouth every 4 (four) hours as needed (for pain scale < 4  OR  temperature  >/=  100.5 F). 02/19/20  Yes Gustavo Lah, CNM  aspirin 81 MG chewable tablet Chew by mouth daily.   Yes [provider]  insulin detemir (LEVEMIR) 100 UNIT/ML injection Inject 65 Units into the skin daily. Before bed   Yes [provider]  metFORMIN (GLUCOPHAGE) 1000 MG tablet Take 1 tablet (1,000 mg total) by mouth 2 (two) times daily with a meal. 12/20/18 12/20/19 Yes Loleta Rose, MD  Prenatal Vit-Fe Fumarate-FA (MULTIVITAMIN-PRENATAL) 27-0.8 MG TABS tablet Take 1 tablet by mouth daily at 12 noon.   Yes [provider]  albuterol (VENTOLIN HFA) 108 (90 Base) MCG/ACT inhaler Inhale 2 puffs into the lungs every 6 (six) hours as needed for wheezing or shortness of breath. Patient not taking: Reported on 04/11/2020 02/19/20   Gustavo Lah, CNM  guaiFENesin-dextromethorphan [facility] DM) 100-10 MG/5ML syrup Take 5 mLs by mouth every 4 (four) hours as needed for cough. Patient not taking: Reported on 04/11/2020 02/19/20   Gustavo Lah, CNM     Social History: She  reports that she has quit smoking. She has never used  smokeless tobacco. She reports that she does not drink alcohol and does not use drugs.  Family History: family history is not on file.  no history of gyn cancers  Review of Systems: A full review of systems was performed and negative except as noted in the HPI.     O:  BP 123/73   Pulse (!) 112   Temp 98.7 F (37.1 C) (Oral)   Resp 18  Results for orders placed or performed during the hospital encounter of 04/11/20 (from the past 48 hour(s))  Glucose, capillary   Collection Time: 04/11/20 12:36 PM  Result Value Ref Range   Glucose-Capillary 87 70 - 99 mg/dL     Constitutional: NAD, AAOx3  HE/ENT: extraocular movements grossly intact, moist mucous membranes CV: RRR PULM: nl respiratory effort, CTABL     Abd: gravid, non-tender, non-distended, soft      Ext: Non-tender, Nonedmeatous   Psych: mood appropriate, speech normal  NST:  Baseline:120  Variability: moderate Accelerations present x >2 Decelerations absent Time    Assessment: 30 y.o. [redacted]w[redacted]d here for antenatal surveillance during pregnancy.  Principle diagnosis: vaginal spotting  A2GDM  CAt 1 fetal strip  Fasting sugars and PP values all elevated .  Plan: Diabetic coordinator to see and instruct on insulin dosing   dietician to counsel her on ADA diet  Levemir 70 unit q hs and reg 8 units in am 6 units in pm  ----- T  Schermerhorn MD Attending Obstetrician and Gynecologist [facility], Department of OB/GYN [facility]

## 2020-04-25 ENCOUNTER — Ambulatory Visit: Payer: Medicaid Other | Admitting: *Deleted

## 2020-04-27 ENCOUNTER — Other Ambulatory Visit: Payer: Self-pay | Admitting: Obstetrics and Gynecology

## 2020-04-27 NOTE — Progress Notes (Signed)
Dating: EDD: 05/27/20  by LMP: 08/15/19, EDD c/w [place] at [redacted]w[redacted]d.   Preg c/b: 1. Lapse in [facility] from 22 to 29wks 2. DM2- 2/22 change Levemir 80 units, continue Humulin 8 units am / 6 units pm, plan for delivery at 37 weeks 3. Obesity, BMI 42.2 4. Varicella non-Immune 5. Anxiety/depression, no meds 6. COVID infection in pregnancy  Prenatal Labs: Blood type/Rh  A pos  Antibody screen neg  Rubella Immune  Varicella  NON Immune  RPR NR  HBsAg Neg  HIV NR  GC neg  Chlamydia neg  Genetic screening negative  1 hour GTT  n/a, A1C 7.5->6.8  3 hour GTT   GBS  pending    Contraception: Infant feeding: Tdap/Flu: declined

## 2020-04-29 LAB — OB RESULTS CONSOLE GBS: GBS: POSITIVE

## 2020-05-07 ENCOUNTER — Inpatient Hospital Stay
Admission: EM | Admit: 2020-05-07 | Discharge: 2020-05-09 | DRG: 807 | Disposition: A | Discharge status: Home / Self Care | Payer: Medicaid Other

## 2020-05-07 ENCOUNTER — Other Ambulatory Visit: Payer: Self-pay

## 2020-05-07 ENCOUNTER — Inpatient Hospital Stay: Payer: Medicaid Other | Admitting: Certified Registered Nurse Anesthetist

## 2020-05-07 ENCOUNTER — Encounter: Payer: Self-pay | Admitting: Obstetrics and Gynecology

## 2020-05-07 DIAGNOSIS — Z87891 Personal history of nicotine dependence: Secondary | ICD-10-CM

## 2020-05-07 DIAGNOSIS — Z3A37 37 weeks gestation of pregnancy: Secondary | ICD-10-CM

## 2020-05-07 DIAGNOSIS — O133 Gestational [pregnancy-induced] hypertension without significant proteinuria, third trimester: Secondary | ICD-10-CM

## 2020-05-07 DIAGNOSIS — Z794 Long term (current) use of insulin: Secondary | ICD-10-CM | POA: Diagnosis not present

## 2020-05-07 DIAGNOSIS — O99214 Obesity complicating childbirth: Secondary | ICD-10-CM | POA: Diagnosis present

## 2020-05-07 DIAGNOSIS — E1165 Type 2 diabetes mellitus with hyperglycemia: Secondary | ICD-10-CM | POA: Diagnosis present

## 2020-05-07 DIAGNOSIS — Z8616 Personal history of COVID-19: Secondary | ICD-10-CM | POA: Diagnosis not present

## 2020-05-07 DIAGNOSIS — O0993 Supervision of high risk pregnancy, unspecified, third trimester: Secondary | ICD-10-CM

## 2020-05-07 DIAGNOSIS — O2412 Pre-existing diabetes mellitus, type 2, in childbirth: Principal | ICD-10-CM | POA: Diagnosis present

## 2020-05-07 DIAGNOSIS — O24113 Pre-existing diabetes mellitus, type 2, in pregnancy, third trimester: Secondary | ICD-10-CM

## 2020-05-07 DIAGNOSIS — O139 Gestational [pregnancy-induced] hypertension without significant proteinuria, unspecified trimester: Secondary | ICD-10-CM | POA: Diagnosis present

## 2020-05-07 DIAGNOSIS — O99824 Streptococcus B carrier state complicating childbirth: Secondary | ICD-10-CM | POA: Diagnosis present

## 2020-05-07 DIAGNOSIS — Z20822 Contact with and (suspected) exposure to covid-19: Secondary | ICD-10-CM | POA: Diagnosis present

## 2020-05-07 DIAGNOSIS — E282 Polycystic ovarian syndrome: Secondary | ICD-10-CM | POA: Insufficient documentation

## 2020-05-07 LAB — COMPREHENSIVE METABOLIC PANEL
ALT: 9 U/L (ref 0–44)
AST: 16 U/L (ref 15–41)
Albumin: 2.9 g/dL — ABNORMAL LOW (ref 3.5–5.0)
Alkaline Phosphatase: 112 U/L (ref 38–126)
Anion gap: 9 (ref 5–15)
BUN: 11 mg/dL (ref 6–20)
CO2: 20 mmol/L — ABNORMAL LOW (ref 22–32)
Calcium: 9.4 mg/dL (ref 8.9–10.3)
Chloride: 106 mmol/L (ref 98–111)
Creatinine, Ser: 0.5 mg/dL (ref 0.44–1.00)
GFR, Estimated: >60 mL/min (ref >60)
Glucose, Bld: 133 mg/dL — ABNORMAL HIGH (ref 70–99)
Potassium: 3.6 mmol/L (ref 3.5–5.1)
Sodium: 135 mmol/L (ref 135–145)
Total Bilirubin: 0.5 mg/dL (ref 0.3–1.2)
Total Protein: 6.8 g/dL (ref 6.5–8.1)

## 2020-05-07 LAB — GLUCOSE, CAPILLARY
Glucose-Capillary: 128 mg/dL — ABNORMAL HIGH (ref 70–99)
Glucose-Capillary: 119 mg/dL — ABNORMAL HIGH (ref 70–99)
Glucose-Capillary: 103 mg/dL — ABNORMAL HIGH (ref 70–99)
Glucose-Capillary: 133 mg/dL — ABNORMAL HIGH (ref 70–99)
Glucose-Capillary: 116 mg/dL — ABNORMAL HIGH (ref 70–99)
Glucose-Capillary: 121 mg/dL — ABNORMAL HIGH (ref 70–99)
Glucose-Capillary: 97 mg/dL (ref 70–99)
Glucose-Capillary: 97 mg/dL (ref 70–99)
Glucose-Capillary: 119 mg/dL — ABNORMAL HIGH (ref 70–99)

## 2020-05-07 LAB — CBC
HCT: 35.5 % — ABNORMAL LOW (ref 36.0–46.0)
Hemoglobin: 12 g/dL (ref 12.0–15.0)
MCH: 28.4 pg (ref 26.0–34.0)
MCHC: 33.8 g/dL (ref 30.0–36.0)
MCV: 83.9 fL (ref 80.0–100.0)
Platelets: 311 10*3/uL (ref 150–400)
RBC: 4.23 MIL/uL (ref 3.87–5.11)
RDW: 13 % (ref 11.5–15.5)
WBC: 10.5 10*3/uL (ref 4.0–10.5)
nRBC: 0 % (ref 0.0–0.2)

## 2020-05-07 LAB — PROTEIN / CREATININE RATIO, URINE
Creatinine, Urine: 237 mg/dL
Protein Creatinine Ratio: 0.14 mg/mg{Cre} (ref 0.00–0.15)
Total Protein, Urine: 34 mg/dL

## 2020-05-07 LAB — HEMOGLOBIN A1C
Hgb A1c MFr Bld: 6.6 % — ABNORMAL HIGH (ref 4.8–5.6)
Mean Plasma Glucose: 142.72 mg/dL

## 2020-05-07 LAB — RESP PANEL BY RT-PCR (FLU A&B, COVID) ARPGX2
Influenza A by PCR: NEGATIVE
Influenza B by PCR: NEGATIVE
SARS Coronavirus 2 by RT PCR: NEGATIVE

## 2020-05-07 MED ORDER — MISOPROSTOL 200 MCG PO TABS
ORAL_TABLET | ORAL | Status: AC
  Administered 2020-05-07: 25 ug via BUCCAL
  Filled 2020-05-07: qty 4

## 2020-05-07 MED ORDER — PRENATAL MULTIVITAMIN CH
1.0000 | ORAL_TABLET | Freq: Every day | ORAL | Status: DC
  Administered 2020-05-08 – 2020-05-09 (×2): 1 via ORAL
  Filled 2020-05-07 (×2): qty 1

## 2020-05-07 MED ORDER — ACETAMINOPHEN 325 MG PO TABS
650.0000 mg | ORAL_TABLET | ORAL | Status: DC | PRN
Start: 2020-05-07 — End: 2020-05-07

## 2020-05-07 MED ORDER — DIBUCAINE (PERIANAL) 1 % EX OINT: 1.0000 "application " | TOPICAL_OINTMENT | CUTANEOUS | Status: DC | PRN

## 2020-05-07 MED ORDER — COCONUT OIL OIL
1.0000 "application " | TOPICAL_OIL | Status: DC | PRN
  Filled 2020-05-07: qty 120

## 2020-05-07 MED ORDER — LACTATED RINGERS IV SOLN: INTRAVENOUS | Status: DC

## 2020-05-07 MED ORDER — AMMONIA AROMATIC IN INHA
RESPIRATORY_TRACT | Status: AC
  Filled 2020-05-07: qty 10

## 2020-05-07 MED ORDER — OXYTOCIN 10 UNIT/ML IJ SOLN
INTRAMUSCULAR | Status: AC
  Filled 2020-05-07: qty 2

## 2020-05-07 MED ORDER — TERBUTALINE SULFATE 1 MG/ML IJ SOLN: 0.2500 mg | Freq: Once | INTRAMUSCULAR | Status: DC | PRN

## 2020-05-07 MED ORDER — ZOLPIDEM TARTRATE 5 MG PO TABS: 5.0000 mg | ORAL_TABLET | Freq: Every evening | ORAL | Status: DC | PRN

## 2020-05-07 MED ORDER — PENICILLIN G POT IN DEXTROSE 60000 UNIT/ML IV SOLN
3.0000 10*6.[IU] | INTRAVENOUS | Status: DC
  Filled 2020-05-07 (×5): qty 50

## 2020-05-07 MED ORDER — LIDOCAINE HCL (PF) 1 % IJ SOLN: 30.0000 mL | INTRAMUSCULAR | Status: DC | PRN

## 2020-05-07 MED ORDER — LIDOCAINE HCL (PF) 1 % IJ SOLN
INTRAMUSCULAR | Status: DC | PRN
  Administered 2020-05-07: 3 mL

## 2020-05-07 MED ORDER — INSULIN REGULAR(HUMAN) IN NACL 100-0.9 UT/100ML-% IV SOLN: INTRAVENOUS | Status: DC

## 2020-05-07 MED ORDER — LACTATED RINGERS IV SOLN: 500.0000 mL | INTRAVENOUS | Status: DC | PRN

## 2020-05-07 MED ORDER — IBUPROFEN 600 MG PO TABS
600.0000 mg | ORAL_TABLET | Freq: Four times a day (QID) | ORAL | Status: DC
  Administered 2020-05-08 – 2020-05-09 (×3): 600 mg via ORAL
  Filled 2020-05-07 (×3): qty 1

## 2020-05-07 MED ORDER — ONDANSETRON HCL 4 MG PO TABS: 4.0000 mg | ORAL_TABLET | ORAL | Status: DC | PRN

## 2020-05-07 MED ORDER — FENTANYL CITRATE (PF) 100 MCG/2ML IJ SOLN
50.0000 ug | INTRAMUSCULAR | Status: DC | PRN
  Administered 2020-05-07: 50 ug via INTRAVENOUS
  Filled 2020-05-07: qty 2

## 2020-05-07 MED ORDER — LIDOCAINE HCL (PF) 1 % IJ SOLN
INTRAMUSCULAR | Status: AC
  Filled 2020-05-07: qty 30

## 2020-05-07 MED ORDER — DEXTROSE IN LACTATED RINGERS 5 % IV SOLN: INTRAVENOUS | Status: DC

## 2020-05-07 MED ORDER — OXYTOCIN-SODIUM CHLORIDE 30-0.9 UT/500ML-% IV SOLN
2.5000 [IU]/h | INTRAVENOUS | Status: DC
Start: 2020-05-07 — End: 2020-05-07
  Administered 2020-05-07: 2.5 [IU]/h via INTRAVENOUS
  Filled 2020-05-07: qty 500

## 2020-05-07 MED ORDER — ACETAMINOPHEN 325 MG PO TABS
650.0000 mg | ORAL_TABLET | ORAL | Status: DC | PRN
  Administered 2020-05-07 – 2020-05-09 (×9): 650 mg via ORAL
  Filled 2020-05-07 (×9): qty 2

## 2020-05-07 MED ORDER — OXYTOCIN-SODIUM CHLORIDE 30-0.9 UT/500ML-% IV SOLN
1.0000 m[IU]/min | INTRAVENOUS | Status: DC
  Filled 2020-05-07: qty 500

## 2020-05-07 MED ORDER — ONDANSETRON HCL 4 MG/2ML IJ SOLN
4.0000 mg | Freq: Four times a day (QID) | INTRAMUSCULAR | Status: DC | PRN
  Administered 2020-05-07: 4 mg via INTRAVENOUS
  Filled 2020-05-07: qty 2

## 2020-05-07 MED ORDER — INSULIN ASPART 100 UNIT/ML ~~LOC~~ SOLN: 0.0000 [IU] | Freq: Three times a day (TID) | SUBCUTANEOUS | Status: DC

## 2020-05-07 MED ORDER — BENZOCAINE-MENTHOL 20-0.5 % EX AERO
1.0000 "application " | INHALATION_SPRAY | CUTANEOUS | Status: DC | PRN
  Administered 2020-05-07: 1 via TOPICAL
  Filled 2020-05-07: qty 56

## 2020-05-07 MED ORDER — MISOPROSTOL 25 MCG QUARTER TABLET
25.0000 ug | ORAL_TABLET | ORAL | Status: DC | PRN
  Administered 2020-05-07 (×2): 25 ug via VAGINAL
  Filled 2020-05-07 (×2): qty 1

## 2020-05-07 MED ORDER — SIMETHICONE 80 MG PO CHEW: 80.0000 mg | CHEWABLE_TABLET | ORAL | Status: DC | PRN

## 2020-05-07 MED ORDER — PROMETHAZINE HCL 25 MG/ML IJ SOLN
INTRAMUSCULAR | Status: AC
  Filled 2020-05-07: qty 1

## 2020-05-07 MED ORDER — WITCH HAZEL-GLYCERIN EX PADS: 1.0000 "application " | MEDICATED_PAD | CUTANEOUS | Status: DC | PRN

## 2020-05-07 MED ORDER — FENTANYL 2.5 MCG/ML W/ROPIVACAINE 0.15% IN NS 100 ML EPIDURAL (ARMC)
EPIDURAL | Status: DC | PRN
  Administered 2020-05-07: 12 mL/h via EPIDURAL

## 2020-05-07 MED ORDER — DIPHENHYDRAMINE HCL 25 MG PO CAPS: 25.0000 mg | ORAL_CAPSULE | Freq: Four times a day (QID) | ORAL | Status: DC | PRN

## 2020-05-07 MED ORDER — OXYTOCIN BOLUS FROM INFUSION
333.0000 mL | Freq: Once | INTRAVENOUS | Status: AC
  Administered 2020-05-07: 333 mL via INTRAVENOUS

## 2020-05-07 MED ORDER — FENTANYL 2.5 MCG/ML W/ROPIVACAINE 0.15% IN NS 100 ML EPIDURAL (ARMC)
EPIDURAL | Status: AC
  Filled 2020-05-07: qty 100

## 2020-05-07 MED ORDER — NIFEDIPINE ER OSMOTIC RELEASE 30 MG PO TB24
30.0000 mg | ORAL_TABLET | Freq: Every day | ORAL | Status: DC
  Administered 2020-05-07 – 2020-05-09 (×3): 30 mg via ORAL
  Filled 2020-05-07 (×3): qty 1

## 2020-05-07 MED ORDER — DEXTROSE 50 % IV SOLN: 0.0000 mL | INTRAVENOUS | Status: DC | PRN

## 2020-05-07 MED ORDER — SODIUM CHLORIDE 0.9 % IV SOLN
5.0000 10*6.[IU] | Freq: Once | INTRAVENOUS | Status: AC
  Administered 2020-05-07: 5 10*6.[IU] via INTRAVENOUS
  Filled 2020-05-07: qty 5

## 2020-05-07 MED ORDER — BUPIVACAINE HCL (PF) 0.25 % IJ SOLN
INTRAMUSCULAR | Status: DC | PRN
  Administered 2020-05-07: 4 mL via EPIDURAL
  Administered 2020-05-07: 3 mL via EPIDURAL

## 2020-05-07 MED ORDER — ONDANSETRON HCL 4 MG/2ML IJ SOLN: 4.0000 mg | INTRAMUSCULAR | Status: DC | PRN

## 2020-05-07 MED ORDER — SENNOSIDES-DOCUSATE SODIUM 8.6-50 MG PO TABS
2.0000 | ORAL_TABLET | Freq: Every day | ORAL | Status: DC
  Administered 2020-05-08 – 2020-05-09 (×2): 2 via ORAL
  Filled 2020-05-07 (×2): qty 2

## 2020-05-07 MED ORDER — INSULIN ASPART 100 UNIT/ML ~~LOC~~ SOLN
1.0000 [IU] | Freq: Three times a day (TID) | SUBCUTANEOUS | Status: DC
  Filled 2020-05-07 (×2): qty 1

## 2020-05-07 MED ORDER — MISOPROSTOL 25 MCG QUARTER TABLET
25.0000 ug | ORAL_TABLET | ORAL | Status: DC | PRN
  Administered 2020-05-07: 25 ug via BUCCAL
  Filled 2020-05-07 (×2): qty 1

## 2020-05-07 MED ORDER — SOD CITRATE-CITRIC ACID 500-334 MG/5ML PO SOLN: 30.0000 mL | ORAL | Status: DC | PRN

## 2020-05-07 MED ORDER — IBUPROFEN 600 MG PO TABS
600.0000 mg | ORAL_TABLET | Freq: Four times a day (QID) | ORAL | Status: DC
Start: 2020-05-07 — End: 2020-05-07
  Administered 2020-05-07: 600 mg via ORAL
  Filled 2020-05-07: qty 1

## 2020-05-07 MED ORDER — LIDOCAINE-EPINEPHRINE (PF) 1.5 %-1:200000 IJ SOLN
INTRAMUSCULAR | Status: DC | PRN
  Administered 2020-05-07: 4 mL via PERINEURAL

## 2020-05-07 NOTE — H&P (Signed)
OB History & Physical   History of Present Illness:   Chief Complaint: scheduled IOL   HPI:  Kristin Evans is a 30 y.o. G1P0 female at [redacted]w[redacted]d dated by LMP of 08/15/2019, c/w [place] at [redacted]w[redacted]d.  She presents to L&D for scheduled IOL for Type II diabetes in pregnancy, poor control.  Reports  active fetal movement  Contractions: denies  LOF/SROM: denies  Vaginal bleeding: denies   Pregnancy Issues: 1. Lapse in [facility] from 22 to 29wks 2. DM2- 2/22 change Levemir 80 units, continue Humulin 8 units am / 6 units pm, plan for delivery at 37 weeks 3. Obesity, BMI 42.2  4. Varicella non-Immune 5. Anxiety/depression, no meds 6. COVID infection in pregnancy 7. GBS positive   Patient Active Problem List   Diagnosis Date Noted  . Diabetes mellitus during pregnancy treated with insulin (HCC) 05/07/2020  . Labor and delivery indication for care or intervention 04/11/2020  . Decreased fetal movement affecting management of pregnancy in third trimester 02/19/2020     Maternal Medical History:   Past Medical History:  Diagnosis Date  . Diabetes mellitus without complication (HCC)   . Obesity     History reviewed. No pertinent surgical history.  No Known Allergies  Prior to Admission medications   Medication Sig Start Date End Date Taking? Authorizing Provider  acetaminophen (TYLENOL) 325 MG tablet Take 2 tablets (650 mg total) by mouth every 4 (four) hours as needed (for pain scale < 4  OR  temperature  >/=  100.5 F). 02/19/20   Gustavo Lah, CNM  albuterol (VENTOLIN HFA) 108 (90 Base) MCG/ACT inhaler Inhale 2 puffs into the lungs every 6 (six) hours as needed for wheezing or shortness of breath. Patient not taking: Reported on 04/11/2020 02/19/20   Gustavo Lah, CNM  aspirin 81 MG chewable tablet Chew by mouth daily.    [provider]  guaiFENesin-dextromethorphan (ROBITUSSIN DM) 100-10 MG/5ML syrup Take 5 mLs by mouth every 4 (four) hours as needed for cough. Patient not  taking: Reported on 04/11/2020 02/19/20   Gustavo Lah, CNM  insulin detemir (LEVEMIR) 100 UNIT/ML injection Inject 65 Units into the skin daily. Before bed    [provider]  metFORMIN (GLUCOPHAGE) 1000 MG tablet Take 1 tablet (1,000 mg total) by mouth 2 (two) times daily with a meal. 12/20/18 12/20/19  Loleta Rose, MD  Prenatal Vit-Fe Fumarate-FA (MULTIVITAMIN-PRENATAL) 27-0.8 MG TABS tablet Take 1 tablet by mouth daily at 12 noon.    [provider]     Prenatal care site:  [facility]  Social History: She  reports that she has quit smoking. She has never used smokeless tobacco. She reports that she does not drink alcohol and does not use drugs.  Family History: family history is not on file.   Review of Systems: A full review of systems was performed and negative except as noted in the HPI.     Physical Exam:  Vital Signs: BP 133/85 (BP Location: Left Arm)   Pulse (!) 108   Temp 98.6 F (37 C) (Oral)   Resp (!) 163   Ht 5\' 3"  (1.6 m)   Wt 124.3 kg   BMI 48.54 kg/m  Physical Exam  General: no acute distress.  HEENT: normocephalic, atraumatic Heart: regular rate & rhythm.  No murmurs/rubs/gallops Lungs: clear to auscultation bilaterally, normal respiratory effort Abdomen: soft, gravid, non-tender;  EFW: 2671g (31%) on 04/29/2020 Pelvic:   External: Normal external female genitalia  Cervix: Cl/th/high by RN    Extremities: non-tender, symmetric, No edema bilaterally.  DTRs: 2+/2+  Neurologic: Alert & oriented x 3.    Results for orders placed or performed during the hospital encounter of 05/07/20 (from the past 24 hour(s))  CBC     Status: Abnormal   Collection Time: 05/07/20 12:47 AM  Result Value Ref Range   WBC 10.5 4.0 - 10.5 K/uL   RBC 4.23 3.87 - 5.11 MIL/uL   Hemoglobin 12.0 12.0 - 15.0 g/dL   HCT 36.6 (L) 44.0 - 34.7 %   MCV 83.9 80.0 - 100.0 fL   MCH 28.4 26.0 - 34.0 pg   MCHC 33.8 30.0 - 36.0 g/dL   RDW 42.5 95.6 - 38.7 %    Platelets 311 150 - 400 K/uL   nRBC 0.0 0.0 - 0.2 %  Type and screen     Status: None   Collection Time: 05/07/20 12:47 AM  Result Value Ref Range   ABO/RH(D) A POS    Antibody Screen POS    Sample Expiration      05/10/2020,2359 Performed at [facility], [address] [place], [place] [address]   Comprehensive metabolic panel     Status: Abnormal   Collection Time: 05/07/20 12:47 AM  Result Value Ref Range   Sodium 135 135 - 145 mmol/L   Potassium 3.6 3.5 - 5.1 mmol/L   Chloride 106 98 - 111 mmol/L   CO2 20 (L) 22 - 32 mmol/L   Glucose, Bld 133 (H) 70 - 99 mg/dL   BUN 11 6 - 20 mg/dL   Creatinine, Ser 2.95 0.44 - 1.00 mg/dL   Calcium 9.4 8.9 - 18.8 mg/dL   Total Protein 6.8 6.5 - 8.1 g/dL   Albumin 2.9 (L) 3.5 - 5.0 g/dL   AST 16 15 - 41 U/L   ALT 9 0 - 44 U/L   Alkaline Phosphatase 112 38 - 126 U/L   Total Bilirubin 0.5 0.3 - 1.2 mg/dL   GFR, Estimated >41 >66 mL/min   Anion gap 9 5 - 15  Protein / creatinine ratio, urine     Status: None   Collection Time: 05/07/20 12:47 AM  Result Value Ref Range   Creatinine, Urine 237 mg/dL   Total Protein, Urine 34 mg/dL   Protein Creatinine Ratio 0.14 0.00 - 0.15 mg/mg[Cre]  Resp Panel by RT-PCR (Flu A&B, Covid) Nasopharyngeal Swab     Status: None   Collection Time: 05/07/20 12:49 AM   Specimen: Nasopharyngeal Swab; Nasopharyngeal(NP) swabs in vial transport medium  Result Value Ref Range   SARS Coronavirus 2 by RT PCR NEGATIVE NEGATIVE   Influenza A by PCR NEGATIVE NEGATIVE   Influenza B by PCR NEGATIVE NEGATIVE  Glucose, capillary     Status: Abnormal   Collection Time: 05/07/20  1:35 AM  Result Value Ref Range   Glucose-Capillary 128 (H) 70 - 99 mg/dL  Glucose, capillary     Status: Abnormal   Collection Time: 05/07/20  3:28 AM  Result Value Ref Range   Glucose-Capillary 119 (H) 70 - 99 mg/dL    Pertinent Results:  Prenatal Labs: Blood type/Rh  A pos  Antibody screen neg  Rubella Immune  Varicella   NON Immune  RPR NR  HBsAg Neg  HIV NR  GC neg  Chlamydia neg  Genetic screening negative  1 hour GTT  n/a, A1C 7.5->6.8  3 hour GTT   GBS Pos    FHT: Baseline: 140  bpm, Variability: moderate, Accelerations: present and Decelerations: Absent TOCO: irregular, mild contractions  SVE:  Cl/th/high   Cephalic by US - performed in office    No results found.  Assessment:  Kristin Evans is a 30 y.o. G1P0 female at [redacted]w[redacted]d with Type II DM in pregnancy, scheduled IOL.   Plan:  1. Admit to Labor & Delivery; consents reviewed and obtained - Covid admission screen   2. Fetal Well being  - Fetal Tracing: cat 1 - Group B Streptococcus ppx indicated: GBS pos  - Will initiate prophylaxis with oxytocin, SROM, or onset of labor - Presentation: cephalic confirmed by US performed in office   3. Routine OB: - Prenatal labs reviewed, as above - Rh Pos - CBC, T&S, RPR on admit - Clear fluids, IVF  4. Type II DM in pregnancy  - Endotool for labor - Monitor CBG every 4 hours during cervical ripening  - Initiate Endotool for CBG 120 or greater x 2   5. Monitoring of labor  -  Contractions monitored with external toco -  Pelvis adequate for trial of labor  -  Plan for induction with misoprostol  -  Foley balloon, oxytocin, and AROM as appropriate  -  Plan for  continuous fetal monitoring -  Maternal pain control as desired - Anticipate vaginal delivery  6. Post Partum Planning: - Infant feeding: TBD - Contraception: TBD - Tdap vaccine: declined  - Flu vaccine: declined   Gustavo Lah, CNM 05/07/20 4:06 AM  Margaretmary Eddy, CNM Certified Nurse Midwife [facility]

## 2020-05-07 NOTE — Progress Notes (Signed)
Reviewed  by CNM proir to Cytotec Placement

## 2020-05-07 NOTE — Progress Notes (Addendum)
Notified Margaretmary Eddy, CNM that patient stated she has not taken any insulin last night as she forgot. Patient's CBG on admission was 128. CNM stated that if she has two consecutive blood sugars greater that 120 to start endo tool. CNM stated CBG q4h until active labor. Patient notified of the plan of care.

## 2020-05-07 NOTE — Progress Notes (Signed)
ADA Standards of Care 2019 Diabetes in Pregnancy Target Glucose Ranges:  Fasting: 60 - 90 mg/dL Preprandial: 60 - 092 mg/dL 1 hr postprandial: Less than 140mg /dL (from first bite of meal) 2 hr postprandial: Less than 120 mg/dL (from first bit of meal)  Results for NASHYA, GARLINGTON (MRN Nyra Capes) as of 05/07/2020 09:05  Ref. Range 05/07/2020 01:35 05/07/2020 03:28 05/07/2020 07:41  Glucose-Capillary Latest Ref Range: 70 - 99 mg/dL 05/09/2020 (H) 333 (H) 545 (H)   Results for SELAM, PIETSCH (MRN Nyra Capes) as of 05/07/2020 06:44  Ref. Range 05/07/2020 00:47  Hemoglobin A1C Latest Ref Range: 4.8 - 5.6 % 6.6 (H)   Admit with: Scheduled IOL [redacted]w[redacted]d  History: Type 2 Diabetes (diagnosed 30 years old)  Home DM Meds: Levemir 80 units QHS       Metformin 1000 mg BID       Humulin Regular 8 units AM/ 6 units PM       (See Prenatal Visit note from 05/02/2020)  Current Orders: LR IVF running 125cc/hr      Novolog 0-16 units 2-hour Post-Prandial for Correction      Novolog 1-5 units TID with meals (1 unit for every 10 grams Carbohydrates)   For IOL this AM  CBG 119 and 103 this AM.  Note pt allowed PO diet this AM and Novolog ordered if needed.    --Will follow patient during hospitalization--  07/02/2020 RN, MSN, CDE Diabetes Coordinator Inpatient Glycemic Control Team Team Pager: [phone] (8a-5p)

## 2020-05-07 NOTE — Anesthesia Preprocedure Evaluation (Addendum)
Anesthesia Evaluation  Patient identified by MRN, date of birth, ID band Patient awake    Reviewed: Allergy & Precautions, H&P , NPO status , Patient's Chart, lab work & pertinent test results  Airway Mallampati: III  TM Distance: <3 FB Neck ROM: full  Mouth opening: Limited Mouth Opening  Dental no notable dental hx.    Pulmonary neg pulmonary ROS, former smoker,    Pulmonary exam normal        Cardiovascular Exercise Tolerance: Good negative cardio ROS Normal cardiovascular exam     Neuro/Psych negative neurological ROS  negative psych ROS   GI/Hepatic Neg liver ROS, GERD  Controlled,  Endo/Other  diabetes, Poorly Controlled, Type obesity  Renal/GU negative Renal ROS  negative genitourinary   Musculoskeletal   Abdominal   Peds  Hematology negative hematology ROS (+)   Anesthesia Other Findings   Reproductive/Obstetrics (+) Pregnancy                             Anesthesia Physical Anesthesia Plan  ASA: III  Anesthesia Plan: Epidural   Post-op Pain Management:    Induction:   PONV Risk Score and Plan:   Airway Management Planned:   Additional Equipment:   Intra-op Plan:   Post-operative Plan:   Informed Consent: I have reviewed the patients History and Physical, chart, labs and discussed the procedure including the risks, benefits and alternatives for the proposed anesthesia with the patient or authorized representative who has indicated his/her understanding and acceptance.     Dental Advisory Given  Plan Discussed with: Anesthesiologist  Anesthesia Plan Comments:        Anesthesia Quick Evaluation

## 2020-05-07 NOTE — Discharge Summary (Signed)
Obstetrical Discharge Summary  Patient Name: Kristin Evans DOB: 1991-01-29 MRN: [id number]  Date of Admission: 05/07/2020 Date of Delivery: 05/07/20 Delivered by: Dala Dock CNM Date of Discharge: 05/09/2020  Primary OB: [facility]  LMP:No LMP recorded. EDC Estimated Date of Delivery: 05/27/20 Gestational Age at Delivery: [redacted]w[redacted]d   Antepartum complications:  1. Lapse in [facility] from 22 to 29wks 2. DM2-2/22 change Levemir 80units, continue Humulin 8 units am / 6 units pm, plan for delivery at 37 weeks 3. Obesity, BMI 42.2  4. Varicella non-Immune 5. Anxiety/depression, no meds 6. COVID infection in pregnancy 7. GBS positive  Admitting Diagnosis: IOL for poorly controled type II DM Secondary Diagnosis: Patient Active Problem List   Diagnosis Date Noted  . NSVD (normal spontaneous vaginal delivery) 05/09/2020  . Gestational hypertension 05/09/2020  . PCOS (polycystic ovarian syndrome) 05/07/2020  . Type 2 diabetes mellitus not at goal [facility]) 04/11/2016    Augmentation: AROM and Cytotec Complications: None Intrapartum complications/course:  Date of Delivery: 05/07/20 Delivered By: Dala Dock CNM Delivery Type: spontaneous vaginal delivery Anesthesia: epidural Placenta: spontaneous Laceration: 2nd degree, perineal Episiotomy: none Newborn Data: Live born female  Birth Weight:  3040g 6lb11.2oz APGAR: 8, 9  Newborn Delivery   Birth date/time: 05/07/2020 12:26:00 Delivery type: Vaginal, Spontaneous        Postpartum Procedures: DM coordinator consult Edinburgh:  Edinburgh Postnatal Depression Scale Screening Tool 05/08/2020 05/07/2020  I have been able to laugh and see the funny side of things. 0 (No Data)  I have looked forward with enjoyment to things. 0 -  I have blamed myself unnecessarily when things went wrong. 0 -  I have been anxious or worried for no good reason. 2 -  I have felt scared or panicky for no good reason. 0 -  Things have been getting on top  of me. 0 -  I have been so unhappy that I have had difficulty sleeping. 0 -  I have felt sad or miserable. 0 -  I have been so unhappy that I have been crying. 0 -  The thought of harming myself has occurred to me. 0 -  Edinburgh Postnatal Depression Scale Total 2 -      Post partum course:  Patient had an uncomplicated postpartum course though her glucose and BP were closely monitored.  By time of discharge on PPD#2, her pain was controlled on oral pain medications; she had appropriate lochia and was ambulating, voiding without difficulty and tolerating regular diet.  She was deemed stable for discharge to home.    Discharge Physical Exam:  BP 128/90 (BP Location: Left Arm)   Pulse 89   Temp 97.8 F (36.6 C) (Oral)   Resp 18   Ht 5\' 3"  (1.6 m)   Wt 124.3 kg   SpO2 99% Comment: Room Air  Breastfeeding Unknown   BMI 48.54 kg/m   General: NAD CV: RRR Pulm: CTABL, nl effort ABD: s/nd/nt, fundus firm and below the umbilicus Lochia: moderate Perineum: well approximated DVT Evaluation: LE non-ttp, no evidence of DVT on exam.  Hemoglobin  Date Value Ref Range Status  05/08/2020 11.3 (L) 12.0 - 15.0 g/dL Final   HGB  Date Value Ref Range Status  03/17/2012 13.7 12.0 - 16.0 g/dL Final   HCT  Date Value Ref Range Status  05/08/2020 33.2 (L) 36.0 - 46.0 % Final  03/17/2012 41.4 35.0 - 47.0 % Final     Disposition: stable, discharge to home. Baby Feeding: breastmilk Baby Disposition: home  with mom  Rh Immune globulin given: n/a Rubella vaccine given: Immune Varicella vaccine given: Non-immune Tdap vaccine given in AP or PP setting: declined Flu vaccine given in AP or PP setting: declined  Contraception: TBD  Prenatal Labs:  Blood type/Rh A pos  Antibody screen neg  Rubella Immune  Varicella NONImmune  RPR NR  HBsAg Neg  HIV NR  GC neg  Chlamydia neg  Genetic screening negative  1 hour GTT n/a, A1C 7.5->6.8  3 hour GTT   GBS Pos      Plan:  Kristin Evans was discharged to home in good condition. Follow-up appointment with delivering provider in 6 weeks.  Discharge Medications: Allergies as of 05/09/2020   No Known Allergies     Medication List    STOP taking these medications   albuterol 108 (90 Base) MCG/ACT inhaler Commonly known as: VENTOLIN HFA   aspirin 81 MG chewable tablet   guaiFENesin-dextromethorphan 100-10 MG/5ML syrup Commonly known as: ROBITUSSIN DM   insulin detemir 100 UNIT/ML injection Commonly known as: LEVEMIR   metFORMIN 1000 MG tablet Commonly known as: Glucophage Replaced by: metFORMIN 500 MG 24 hr tablet     TAKE these medications   acetaminophen 325 MG tablet Commonly known as: TYLENOL Take 2 tablets (650 mg total) by mouth every 4 (four) hours as needed (for pain scale < 4  OR  temperature  >/=  100.5 F).   metFORMIN 500 MG 24 hr tablet Commonly known as: GLUCOPHAGE-XR Take 1 tablet (500 mg total) by mouth daily with breakfast. Start taking on: May 10, 2020 Replaces: metFORMIN 1000 MG tablet   multivitamin-prenatal 27-0.8 MG Tabs tablet Take 1 tablet by mouth daily at 12 noon.   NIFEdipine 30 MG 24 hr tablet Commonly known as: ADALAT CC Take 1 tablet (30 mg total) by mouth daily. Start taking on: May 10, 2020        Follow-up Information    McVey, Prudencio Pair, CNM. Schedule an appointment as soon as possible for a visit in 2 week(s).   Specialty: Obstetrics and Gynecology Why: postpartum BP and mood check Contact information: [address] [place] [address] [phone]               Signed:  Cyril Mourning, CNM 05/09/2020 9:12 AM

## 2020-05-07 NOTE — Progress Notes (Signed)
Labor Progress Note  Kristin Evans is a 30 y.o. G1P0 at [redacted]w[redacted]d by LMP admitted for induction of labor due to Diabetes.  Subjective: feeling pain in lower abdomen and pelvis. Hungry and requesting breakfast.   Objective: BP (!) 142/81   Pulse 79   Temp 97.8 F (36.6 C) (Oral)   Resp 18   Ht 5\' 3"  (1.6 m)   Wt 124.3 kg   BMI 48.54 kg/m  Notable VS details: reviewed- mild range BP noted.   General: no acute distress but notably anxious.  HEENT: normocephalic, atraumatic Heart: regular rate & rhythm.  No murmurs/rubs/gallops Lungs: clear to auscultation bilaterally, normal respiratory effort Abdomen: soft, gravid, non-tender;  EFW: 2671g (31%) on 04/29/2020 Pelvic: deferred              Extremities: non-tender, symmetric, No edema bilaterally.  DTRs: 2+/2+  Neurologic: Alert & oriented x 3.    Fetal Assessment: FHT:  FHR: 135 bpm, variability: moderate,  accelerations:  Present,  decelerations:  Absent Category/reactivity:  Category I UC:   irregular, every 1-7 minutes SVE:   Per nursing exam  Dilation: 1.5 Effacement (%): 70 Cervical Position: Middle Station: -2 Presentation: Vertex Exam by:: [email or web address] RN  Membrane status: intact Amniotic color: n/a  Labs: Lab Results  Component Value Date   WBC 10.5 05/07/2020   HGB 12.0 05/07/2020   HCT 35.5 (L) 05/07/2020   MCV 83.9 05/07/2020   PLT 311 05/07/2020    Assessment / Plan: G1 at 37.1, DM2   DM2- d/w Dr 05/09/2020, pt may have carb modified diet this AM, meal and postprandial coverage ordered.  Labor: cytotec x 2 doses Preeclampsia:  P/C ratio 0.14, normal platelets and LFTs Fetal Wellbeing:  Category I Pain Control:  per maternal request.  I/D:  POS- start PCN now.  Anticipated MOD:  NSVD  Feliberto Gottron, CNM 05/07/2020, 9:01 AM

## 2020-05-07 NOTE — Plan of Care (Signed)
  Problem: Education: Goal: Knowledge of General Education information will improve Description: Including pain rating scale, medication(s)/side effects and non-pharmacologic comfort measures Outcome: Progressing   Problem: Education: Goal: Knowledge of condition will improve Outcome: Progressing Goal: Individualized Educational Video(s) Outcome: Progressing   Problem: Activity: Goal: Will verbalize the importance of balancing activity with adequate rest periods Outcome: Progressing Goal: Ability to tolerate increased activity will improve Outcome: Progressing   Problem: Coping: Goal: Ability to identify and utilize available resources and services will improve Outcome: Progressing   Problem: Life Cycle: Goal: Chance of risk for complications during the postpartum period will decrease Outcome: Progressing   Problem: Role Relationship: Goal: Ability to demonstrate positive interaction with newborn will improve Outcome: Progressing   Problem: Skin Integrity: Goal: Demonstration of wound healing without infection will improve Outcome: Progressing

## 2020-05-07 NOTE — Progress Notes (Signed)
Patient arrived to unit ambulatory for her scheduled Cervidil IOL d/t Diabetes type 2 insulin dependent. Patient reports no symptoms consistent with active vaginal bleeding or ROM. Patient reports active fetal movement. Patient placed on EFM and TOCO to non tender area of abdomen. Patient oriented to care environment and verbally acknowledged understanding of visitation policies due to Covid. Patient provided admission booklet and began reviewing history and admission questions. Margaretmary Eddy, CNM on unit and notified of patient's arrival.

## 2020-05-07 NOTE — Progress Notes (Signed)
Labor Progress Note  Kristin Evans is a 30 y.o. G1P0 at [redacted]w[redacted]d by LMP admitted for induction of labor due to Diabetes.  Subjective: feeling pain/pressure in lower abdomen and pelvis. Nausea and vomiting.   Objective: BP (!) 142/81   Pulse 79   Temp 97.8 F (36.6 C) (Oral)   Resp 18   Ht 5\' 3"  (1.6 m)   Wt 124.3 kg   BMI 48.54 kg/m  Notable VS details: reviewed- mild range BP noted.   General: no acute distress but notably anxious.  HEENT: normocephalic, atraumatic Heart: regular rate & rhythm.  No murmurs/rubs/gallops Lungs: clear to auscultation bilaterally, normal respiratory effort Extremities: non-tender, symmetric, No edema bilaterally.  DTRs: 2+/2+  Neurologic: Alert & oriented x 3.    Fetal Assessment: FHT:  FHR: 135 bpm, variability: moderate,  accelerations:  Present,  decelerations:  Absent Category/reactivity:  Category I UC:   irregular, every 2-3 minutes SVE:   5/C/-1, AROM performed, small amt clear fluid. FSE and IUPC placed.    Labs: Lab Results  Component Value Date   WBC 10.5 05/07/2020   HGB 12.0 05/07/2020   HCT 35.5 (L) 05/07/2020   MCV 83.9 05/07/2020   PLT 311 05/07/2020    Assessment / Plan: G1 at 37.1, DM2   DM2- updated Dr 05/09/2020, q1hr CBG to start now, pt did not eat breakfast.  Labor: cytotec x 2 doses Preeclampsia:  P/C ratio 0.14, normal platelets and LFTs Fetal Wellbeing:  Category I Pain Control:  per maternal request. epidural requested.  I/D:  POS- PCN x 1 dose given . Anticipated MOD:  NSVD  Feliberto Gottron, CNM 05/07/2020, 11:01 AM

## 2020-05-07 NOTE — Anesthesia Procedure Notes (Signed)
Epidural Patient location during procedure: OB Start time: 05/07/2020 10:37 AM End time: 05/07/2020 10:54 AM  Staffing Anesthesiologist: Naomie Dean, MD Resident/CRNA: Ginger Carne, CRNA Performed: resident/CRNA   Preanesthetic Checklist Completed: patient identified, IV checked, site marked, risks and benefits discussed, surgical consent, monitors and equipment checked, pre-op evaluation and timeout performed  Epidural Patient position: sitting Prep: ChloraPrep Patient monitoring: heart rate, continuous pulse ox and blood pressure Approach: midline Location: L3-L4 Injection technique: LOR saline  Needle:  Needle type: Tuohy  Needle gauge: 17 G Needle length: 9 cm and 9 Needle insertion depth: 9 cm Catheter type: closed end flexible Catheter size: 19 Gauge Catheter at skin depth: 12 cm Test dose: negative and 1.5% lidocaine with Epi 1:200 K  Assessment Sensory level: T10 Events: blood not aspirated, injection not painful, no injection resistance, no paresthesia and negative IV test  Additional Notes 1 attempt Pt. Evaluated and documentation done after procedure finished. Patient identified. Risks/Benefits/Options discussed with patient including but not limited to bleeding, infection, nerve damage, paralysis, failed block, incomplete pain control, headache, blood pressure changes, nausea, vomiting, reactions to medication both or allergic, itching and postpartum back pain. Confirmed with bedside nurse the patient's most recent platelet count. Confirmed with patient that they are not currently taking any anticoagulation, have any bleeding history or any family history of bleeding disorders. Patient expressed understanding and wished to proceed. All questions were answered. Sterile technique was used throughout the entire procedure. Please see nursing notes for vital signs. Test dose was given through epidural catheter and negative prior to continuing to dose epidural  or start infusion. Warning signs of high block given to the patient including shortness of breath, tingling/numbness in hands, complete motor block, or any concerning symptoms with instructions to call for help. Patient was given instructions on fall risk and not to get out of bed. All questions and concerns addressed with instructions to call with any issues or inadequate analgesia.   Patient tolerated the insertion well without immediate complications.Reason for block:procedure for pain

## 2020-05-08 LAB — GLUCOSE, CAPILLARY
Glucose-Capillary: 91 mg/dL (ref 70–99)
Glucose-Capillary: 90 mg/dL (ref 70–99)
Glucose-Capillary: 89 mg/dL (ref 70–99)
Glucose-Capillary: 169 mg/dL — ABNORMAL HIGH (ref 70–99)

## 2020-05-08 LAB — COMPREHENSIVE METABOLIC PANEL
ALT: 8 U/L (ref 0–44)
AST: 17 U/L (ref 15–41)
Albumin: 2.5 g/dL — ABNORMAL LOW (ref 3.5–5.0)
Alkaline Phosphatase: 97 U/L (ref 38–126)
Anion gap: 7 (ref 5–15)
BUN: 9 mg/dL (ref 6–20)
CO2: 22 mmol/L (ref 22–32)
Calcium: 9.2 mg/dL (ref 8.9–10.3)
Chloride: 107 mmol/L (ref 98–111)
Creatinine, Ser: 0.49 mg/dL (ref 0.44–1.00)
GFR, Estimated: >60 mL/min (ref >60)
Glucose, Bld: 106 mg/dL — ABNORMAL HIGH (ref 70–99)
Potassium: 3.6 mmol/L (ref 3.5–5.1)
Sodium: 136 mmol/L (ref 135–145)
Total Bilirubin: 0.4 mg/dL (ref 0.3–1.2)
Total Protein: 5.9 g/dL — ABNORMAL LOW (ref 6.5–8.1)

## 2020-05-08 LAB — CBC
HCT: 33.2 % — ABNORMAL LOW (ref 36.0–46.0)
Hemoglobin: 11.3 g/dL — ABNORMAL LOW (ref 12.0–15.0)
MCH: 28.3 pg (ref 26.0–34.0)
MCHC: 34 g/dL (ref 30.0–36.0)
MCV: 83 fL (ref 80.0–100.0)
Platelets: 274 10*3/uL (ref 150–400)
RBC: 4 MIL/uL (ref 3.87–5.11)
RDW: 13.1 % (ref 11.5–15.5)
WBC: 12.3 10*3/uL — ABNORMAL HIGH (ref 4.0–10.5)
nRBC: 0 % (ref 0.0–0.2)

## 2020-05-08 NOTE — Progress Notes (Signed)
Inpatient Diabetes Program Recommendations  AACE/ADA: New Consensus Statement on Inpatient Glycemic Control   Target Ranges:  Prepandial:   less than 140 mg/dL      Peak postprandial:   less than 180 mg/dL (1-2 hours)      Critically ill patients:  140 - 180 mg/dL   Results for ADALAI, PERL (MRN [id number]) as of 05/08/2020 09:53  Ref. Range 05/07/2020 07:41 05/07/2020 10:37 05/07/2020 11:46 05/07/2020 12:54 05/07/2020 13:57 05/07/2020 18:04 05/07/2020 23:24 05/08/2020 09:07  Glucose-Capillary Latest Ref Range: 70 - 99 mg/dL 951 (H) 884 (H) 166 (H) 121 (H) 97 97 119 (H) 91   Review of Glycemic Control  Diabetes history: DM2 Outpatient Diabetes medications: Levemir 80 units QHS, Humulin Regular 8 units QAM, 6 units PM, Metformin 1000 mg BID Current orders for Inpatient glycemic control: Novolog 0-20 units TID with meals   Inpatient Diabetes Program Recommendations:    Outpatient recommendations for DM control: May want to consider discharging patient on Metformin 500 mg daily and have her follow up with PCP regarding DM management.   NOTE: Since delivery, glucose has ranged from 91-119 mg/dl and patient has not received any Novolog correction insulin.    Thanks, Orlando Penner, RN, MSN, CDE Diabetes Coordinator Inpatient Diabetes Program [phone] (Team Pager from 8am to 5pm)

## 2020-05-08 NOTE — Anesthesia Postprocedure Evaluation (Signed)
Anesthesia Post Note  Patient: Kristin Evans  Procedure(s) Performed: AN AD HOC LABOR EPIDURAL  Patient location during evaluation: Mother Baby Anesthesia Type: Epidural Level of consciousness: awake and alert Pain management: pain level controlled Vital Signs Assessment: post-procedure vital signs reviewed and stable Respiratory status: spontaneous breathing, nonlabored ventilation and respiratory function stable Cardiovascular status: stable Postop Assessment: no headache, no backache and epidural receding Anesthetic complications: no   No complications documented.   Last Vitals:  Vitals:   05/08/20 0314 05/08/20 0818  BP: 134/88 124/85  Pulse: 84 87  Resp: 20 18  Temp: 36.8 C 36.6 C  SpO2: 98% 97%    Last Pain:  Vitals:   05/08/20 0818  TempSrc: Oral  PainSc:                  Karoline Caldwell

## 2020-05-08 NOTE — Progress Notes (Signed)
Post Partum Day 1 Subjective: Doing well, no complaints.  Tolerating regular diet, pain with PO meds, voiding and ambulating without difficulty.  No CP SOB Fever,Chills, N/V or leg pain; denies nipple or breast pain, no HA change of vision, RUQ/epigastric pain  Objective: BP 124/85 (BP Location: Right Arm)   Pulse 87   Temp 97.8 F (36.6 C) (Oral)   Resp 18   Ht 5\' 3"  (1.6 m)   Wt 124.3 kg   SpO2 97%   Breastfeeding Unknown   BMI 48.54 kg/m    Physical Exam:  General: NAD Breasts: soft/nontender CV: RRR Pulm: nl effort, CTABL Abdomen: soft, NT, BS x 4 Perineum: minimal edema, lacerations repair well approximated Lochia: moderate  Uterine Fundus: fundus firm and 2 fb below umbilicus DVT Evaluation: no cords, ttp LEs   Recent Labs    05/07/20 0047 05/08/20 0325  HGB 12.0 11.3*  HCT 35.5* 33.2*  WBC 10.5 12.3*  PLT 311 274    Assessment/Plan: 30 y.o. G1P1001 postpartum day # 1  - Continue routine PP care - Lactation consult prn.  - Discussed contraceptive options including implant, IUDs hormonal and non-hormonal, injection, pills/ring/patch, condoms, and NFP. Undecided.  - GHTN- elevated BP, stable and normotensive now on procardia 30mg  daily.   - Immunization status: Needs varicella prior to DC - DM2- diabetes coordinator to see pt today, has not needed any insulin coverage. pre-preg on Metformin, will restart if diabetes coord agrees.   Disposition: Does not desire Dc home today.     05/10/20, CNM 05/08/2020  8:56 AM

## 2020-05-08 NOTE — Discharge Instructions (Signed)

## 2020-05-08 NOTE — Lactation Note (Signed)
This note was copied from a baby's chart. Lactation Consultation Note  Patient Name: Kristin Evans Today's Date: 05/08/2020 Reason for consult: Initial assessment;1st time breastfeeding;Early term 37-38.6wks Age:29 hours   Initial lactation visit. Mom is G1P1, SVD to [redacted]w[redacted]d baby Kristin. Baby breastfed after delivery, but had initial low BS with formula supplement intervention chosen. Baby has been breastfeeding since then with use of nipple shield. Mom notes colostrum in shield after feeding, audible swallows, and several wet/stool diapers. Mom has some slight discomfort with feeding; initial pain that subsides with continued feed. LC discussed feeding position and latch even with nipple shield, use of breast massage/compression to help move colostrum easier for baby, and transient nipple pain/tenderness. Reviewed newborn feeding patterns, anticipation of cluster feeding and growth spurts, signs of adequate intake, milk supply and demand, and normal course of lactation. Mom has several pumps at home that she plans to utilize once home. LC and mom discussed pumping plan with continued use of nipple shield to ensure adequate emptying and prevention of clogged ducts and mastitis. Encouraged continued breast massage/compression when baby is feeding as option too.  Mom encouraged to call out today for observation of feeding. Lactation name/number updated on whiteboard.  Maternal Data Has patient been taught Hand Expression?: Yes Does the patient have breastfeeding experience prior to this delivery?: No  Feeding Mother's Current Feeding Choice: Breast Milk  LATCH Score                    Lactation Tools Discussed/Used Tools: Nipple Shields Nipple shield size: 24  Interventions Interventions: Breast feeding basics reviewed;Education  Discharge    Consult Status Consult Status: Follow-up Date: 05/08/20 Follow-up type: In-patient    Danford Bad 05/08/2020, 2:00  PM

## 2020-05-09 DIAGNOSIS — O139 Gestational [pregnancy-induced] hypertension without significant proteinuria, unspecified trimester: Secondary | ICD-10-CM | POA: Diagnosis present

## 2020-05-09 LAB — BPAM RBC
Blood Product Expiration Date: 202204122359
Blood Product Expiration Date: 202204132359
Unit Type and Rh: 6200
Unit Type and Rh: 6200

## 2020-05-09 LAB — TYPE AND SCREEN
ABO/RH(D): A POS
Antibody Screen: POSITIVE
Unit division: 0
Unit division: 0

## 2020-05-09 LAB — GLUCOSE, CAPILLARY: Glucose-Capillary: 99 mg/dL (ref 70–99)

## 2020-05-09 LAB — RPR: RPR Ser Ql: NONREACTIVE

## 2020-05-09 MED ORDER — METFORMIN HCL ER 500 MG PO TB24: 500.0000 mg | ORAL_TABLET | Freq: Every day | ORAL | Status: DC

## 2020-05-09 MED ORDER — METFORMIN HCL ER 500 MG PO TB24
500.0000 mg | ORAL_TABLET | Freq: Every day | ORAL | Status: DC
  Administered 2020-05-09: 500 mg via ORAL
  Filled 2020-05-09: qty 1

## 2020-05-09 MED ORDER — METFORMIN HCL ER 500 MG PO TB24: 500.0000 mg | ORAL_TABLET | Freq: Every day | ORAL | 11 refills | Status: DC

## 2020-05-09 MED ORDER — NIFEDIPINE ER 30 MG PO TB24: 30.0000 mg | ORAL_TABLET | Freq: Every day | ORAL | 11 refills | Status: DC

## 2020-05-09 NOTE — Lactation Note (Signed)
This note was copied from a baby's chart. Lactation Consultation Note  Patient Name: Kristin Evans [id number] Date: 05/09/2020 Reason for consult: Follow-up assessment;1st time breastfeeding;Early term 37-38.6wks Age:29 hours  Baby at breast following heel stick for bilirubin level check. Baby in cradle hold with use of support pillow and nipple shield. Baby latched deeply with flanged lips, a few audible swallows. Mom indicates that baby had been on that side (R) for about 10 minutes. Good position and alignment pointed out to mom, and encouraged mom to keep baby deep at the breast.  Baby off breast and mom independently switched sides with use of nipple shield again and support pillows. Baby latched with minimal assistance, and had strong rhythmic sucking pattern. Again, LC pointed out positive positioning and alignment, evidence of swallows, and remnants in shield from previous side. LC did encourage opting to leave baby on one side working to ensure fully emptying before offering opposite side. Use of breast massage and compression, and re-awakening baby and re-latching to same side if needed.  LC provided at-home breastfeeding education: feeding patterns, cluster feedings, signs of adequate transfer/intake, output expectations, breast fullness and engorgement and management of both, nipple care, and when to seek support/MD care. Information provided to parents about outpatient lactation support and community breastfeeding resources. Encouraged to call with questions or for appointment.  Maternal Data Has patient been taught Hand Expression?: Yes Does the patient have breastfeeding experience prior to this delivery?: No  Feeding Mother's Current Feeding Choice: Breast Milk  LATCH Score Latch: Grasps breast easily, tongue down, lips flanged, rhythmical sucking.  Audible Swallowing: A few with stimulation  Type of Nipple: Everted at rest and after stimulation  Comfort  (Breast/Nipple): Soft / non-tender  Hold (Positioning): No assistance needed to correctly position infant at breast.  LATCH Score: 9   Lactation Tools Discussed/Used Tools: Nipple Shields Nipple shield size: 24  Interventions Interventions: Breast feeding basics reviewed;Education  Discharge Discharge Education: Engorgement and breast care;Warning signs for feeding baby;Outpatient recommendation Pump: DEBP;Personal  Consult Status Consult Status: PRN Date: 05/09/20 Follow-up type: Call as needed    Danford Bad 05/09/2020, 1:55 PM

## 2020-05-09 NOTE — Lactation Note (Signed)
This note was copied from a baby's chart. Lactation Consultation Note  Patient Name: Girl Elianie Hubers Langstaff [id number]'M Date: 05/09/2020   Age:29 hours  Lactation check-in. Baby has elevated bilirubin levels and was placed under lights. Baby sleeping soundly, mom resting. LC to check back in. Mom has been encouraged to continue feeding on demand, and placing baby immediately back under photo therapy lights.  Danford Bad 05/09/2020, 9:59 AM

## 2020-05-09 NOTE — Progress Notes (Signed)
Discharge order received from doctor. Varicella vaccine offered at discharge. Pt refused varicella vaccine. Reviewed discharge instructions and prescriptions with patient and answered all questions. Follow up appointment instructions given. Patient verbalized understanding. ID bands checked. Patient discharged home with infant via wheelchair by nursing/auxillary.    Inge Rise, RN

## 2020-05-09 NOTE — Lactation Note (Signed)
This note was copied from a baby's chart. Lactation Consultation Note  Patient Name: Kristin Evans Date: 05/09/2020 Reason for consult: Follow-up assessment;1st time breastfeeding;Early term 37-38.6wks (phototherapy) Age:29 hours  Lactation follow-up. Baby last fed at 0740, and has remained content under photo therapy, allowing mom to rest. Baby due for re-check at 12pm, mom planning to fed at this time. Mom reports audible swallows that have gotten louder overnight, more "leftovers" in shield post feeding, transitional stool this morning, and even successful feeds without the shield.  LC praised mom for her dedication to BF, monitoring hunger cues, tracking feedings, and trying feedings without the shield.  LC to observe next feeding post heel prick for latch/position and transfer assessment. Phone number updated on whiteboard for mom.  Maternal Data Has patient been taught Hand Expression?: Yes Does the patient have breastfeeding experience prior to this delivery?: No  Feeding Mother's Current Feeding Choice: Breast Milk  LATCH Score                    Lactation Tools Discussed/Used Tools: Coconut oil;Nipple Shields Nipple shield size: 24  Interventions Interventions: Breast feeding basics reviewed;Education  Discharge    Consult Status Consult Status: Follow-up Date: 05/09/20 Follow-up type: In-patient    Danford Bad 05/09/2020, 11:49 AM

## 2020-08-09 ENCOUNTER — Encounter: Payer: Self-pay | Admitting: Emergency Medicine

## 2020-08-09 ENCOUNTER — Emergency Department: Payer: Medicaid Other

## 2020-08-09 ENCOUNTER — Emergency Department
Admission: EM | Admit: 2020-08-09 | Discharge: 2020-08-09 | Disposition: A | Discharge status: Home / Self Care | Payer: Medicaid Other | Attending: Emergency Medicine | Admitting: Emergency Medicine

## 2020-08-09 ENCOUNTER — Other Ambulatory Visit: Payer: Self-pay

## 2020-08-09 DIAGNOSIS — Z7984 Long term (current) use of oral hypoglycemic drugs: Secondary | ICD-10-CM | POA: Insufficient documentation

## 2020-08-09 DIAGNOSIS — E119 Type 2 diabetes mellitus without complications: Secondary | ICD-10-CM | POA: Insufficient documentation

## 2020-08-09 DIAGNOSIS — R112 Nausea with vomiting, unspecified: Secondary | ICD-10-CM

## 2020-08-09 DIAGNOSIS — Z87891 Personal history of nicotine dependence: Secondary | ICD-10-CM | POA: Diagnosis not present

## 2020-08-09 DIAGNOSIS — K8020 Calculus of gallbladder without cholecystitis without obstruction: Secondary | ICD-10-CM

## 2020-08-09 DIAGNOSIS — R1011 Right upper quadrant pain: Secondary | ICD-10-CM

## 2020-08-09 LAB — URINALYSIS, COMPLETE (UACMP) WITH MICROSCOPIC
Bilirubin Urine: NEGATIVE
Glucose, UA: NEGATIVE mg/dL
Hgb urine dipstick: NEGATIVE
Ketones, ur: NEGATIVE mg/dL
Leukocytes,Ua: NEGATIVE
Nitrite: NEGATIVE
Protein, ur: NEGATIVE mg/dL
Specific Gravity, Urine: 1.024 (ref 1.005–1.030)
pH: 5 (ref 5.0–8.0)

## 2020-08-09 LAB — COMPREHENSIVE METABOLIC PANEL
ALT: 29 U/L (ref 0–44)
AST: 20 U/L (ref 15–41)
Albumin: 4.1 g/dL (ref 3.5–5.0)
Alkaline Phosphatase: 55 U/L (ref 38–126)
Anion gap: 6 (ref 5–15)
BUN: 19 mg/dL (ref 6–20)
CO2: 27 mmol/L (ref 22–32)
Calcium: 9.1 mg/dL (ref 8.9–10.3)
Chloride: 105 mmol/L (ref 98–111)
Creatinine, Ser: 0.64 mg/dL (ref 0.44–1.00)
GFR, Estimated: >60 mL/min (ref >60)
Glucose, Bld: 173 mg/dL — ABNORMAL HIGH (ref 70–99)
Potassium: 3.8 mmol/L (ref 3.5–5.1)
Sodium: 138 mmol/L (ref 135–145)
Total Bilirubin: 0.6 mg/dL (ref 0.3–1.2)
Total Protein: 7.8 g/dL (ref 6.5–8.1)

## 2020-08-09 LAB — CBC
HCT: 38.8 % (ref 36.0–46.0)
Hemoglobin: 12.8 g/dL (ref 12.0–15.0)
MCH: 27.9 pg (ref 26.0–34.0)
MCHC: 33 g/dL (ref 30.0–36.0)
MCV: 84.7 fL (ref 80.0–100.0)
Platelets: 418 10*3/uL — ABNORMAL HIGH (ref 150–400)
RBC: 4.58 MIL/uL (ref 3.87–5.11)
RDW: 13.2 % (ref 11.5–15.5)
WBC: 8 10*3/uL (ref 4.0–10.5)
nRBC: 0 % (ref 0.0–0.2)

## 2020-08-09 LAB — LIPASE, BLOOD: Lipase: 26 U/L (ref 11–51)

## 2020-08-09 MED ORDER — ONDANSETRON 4 MG PO TBDP
4.0000 mg | ORAL_TABLET | Freq: Once | ORAL | Status: AC | PRN
  Administered 2020-08-09: 4 mg via ORAL
  Filled 2020-08-09: qty 1

## 2020-08-09 MED ORDER — ONDANSETRON 4 MG PO TBDP: 4.0000 mg | ORAL_TABLET | Freq: Three times a day (TID) | ORAL | 0 refills | Status: DC | PRN

## 2020-08-09 MED ORDER — DICYCLOMINE HCL 10 MG PO CAPS: 10.0000 mg | ORAL_CAPSULE | Freq: Four times a day (QID) | ORAL | 0 refills | Status: DC

## 2020-08-09 MED ORDER — DICYCLOMINE HCL 10 MG PO CAPS
10.0000 mg | ORAL_CAPSULE | Freq: Once | ORAL | Status: AC
  Administered 2020-08-09: 10 mg via ORAL
  Filled 2020-08-09: qty 1

## 2020-08-09 NOTE — ED Triage Notes (Signed)
Pt arrived via POV with reports of RUQ abd pain x 1 year with vomiting. Pt states when she eats food late at night she gets abd pain and vomiting. Pt states over the past 3 days the pain has been worse. Pt states the pain used to be worse if she was eating fast foods, but states now the pain is with any food.  Pt still has gallbladder.

## 2020-08-09 NOTE — ED Notes (Signed)
Patient passed po challenge. Patient ate a cup of applesauce, she denies n/v at this time. MD notified of results.

## 2020-08-09 NOTE — ED Provider Notes (Signed)
[facility] Emergency Department Provider Note   ____________________________________________   Event Date/Time   First MD Initiated Contact with Patient 08/09/20 [phone]     (approximate)  I have reviewed the triage vital signs and the nursing notes.   HISTORY  Chief Complaint Abdominal Pain    HPI Kristin Evans is a 30 y.o. female with stated past medical history who presents for right upper quadrant pain that has been intermittent but worsening over the last year.  Patient states acute worsening over the last 3 days in which anything she eats causes right upper quadrant pain, nausea, vomiting.  Patient describes the right upper quadrant pain as 8/10 and occasionally radiating to the epigastric region.  Patient denies any relieving factors other than time from last p.o. intake.  Patient has not tried taking any medications for this pain.  Patient currently denies any vision changes, tinnitus, difficulty speaking, facial droop, sore throat, chest pain, shortness of breath, diarrhea, dysuria, or weakness/numbness/paresthesias in any extremity         Past Medical History:  Diagnosis Date   Diabetes mellitus without complication (HCC)    Obesity     Patient Active Problem List   Diagnosis Date Noted   NSVD (normal spontaneous vaginal delivery) 05/09/2020   Gestational hypertension 05/09/2020   PCOS (polycystic ovarian syndrome) 05/07/2020   Type 2 diabetes mellitus not at goal [facility]) 04/11/2016    History reviewed. No pertinent surgical history.  Prior to Admission medications   Medication Sig Start Date End Date Taking? Authorizing Provider  dicyclomine (BENTYL) 10 MG capsule Take 1 capsule (10 mg total) by mouth 4 (four) times daily for 14 days. 08/09/20 08/23/20 Yes Merwyn Katos, MD  ondansetron (ZOFRAN ODT) 4 MG disintegrating tablet Take 1 tablet (4 mg total) by mouth every 8 (eight) hours as needed for nausea or vomiting. 08/09/20   Yes Tasfia Vasseur, Clent Jacks, MD  acetaminophen (TYLENOL) 325 MG tablet Take 2 tablets (650 mg total) by mouth every 4 (four) hours as needed (for pain scale < 4  OR  temperature  >/=  100.5 F). 02/19/20   Gustavo Lah, CNM  metFORMIN (GLUCOPHAGE-XR) 500 MG 24 hr tablet Take 1 tablet (500 mg total) by mouth daily with breakfast. 05/10/20 05/10/21  Haroldine Laws, CNM  NIFEdipine (ADALAT CC) 30 MG 24 hr tablet Take 1 tablet (30 mg total) by mouth daily. 05/10/20 05/10/21  Haroldine Laws, CNM  Prenatal Vit-Fe Fumarate-FA (MULTIVITAMIN-PRENATAL) 27-0.8 MG TABS tablet Take 1 tablet by mouth daily at 12 noon.    [provider]    Allergies Pravastatin  History reviewed. No pertinent family history.  Social History Social History   Tobacco Use   Smoking status: Former    Pack years: 0.00   Smokeless tobacco: Never  Substance Use Topics   Alcohol use: No   Drug use: No    Review of Systems Constitutional: No fever/chills Eyes: No visual changes. ENT: No sore throat. Cardiovascular: Denies chest pain. Respiratory: Denies shortness of breath. Gastrointestinal: Endorses right upper quadrant abdominal pain with associated nausea and vomiting.  No diarrhea. Genitourinary: Negative for dysuria. Musculoskeletal: Negative for acute arthralgias Skin: Negative for rash. Neurological: Negative for headaches, weakness/numbness/paresthesias in any extremity Psychiatric: Negative for suicidal ideation/homicidal ideation   ____________________________________________   PHYSICAL EXAM:  VITAL SIGNS: ED Triage Vitals  Enc Vitals Group     BP 08/09/20 0110 130/77     Pulse Rate 08/09/20 0110 96  Resp 08/09/20 0110 16     Temp 08/09/20 0110 98.2 F (36.8 C)     Temp Source 08/09/20 0110 Oral     SpO2 08/09/20 0110 97 %     Weight 08/09/20 0105 254 lb (115.2 kg)     Height 08/09/20 0105 5\' 3"  (1.6 m)     Head Circumference --      Peak Flow --      Pain Score 08/09/20 0105 8      Pain Loc --      Pain Edu? --      Excl. in GC? --    Constitutional: Alert and oriented. Well appearing and in no acute distress. Eyes: Conjunctivae are normal. PERRL. Head: Atraumatic. Nose: No congestion/rhinnorhea. Mouth/Throat: Mucous membranes are moist. Neck: No stridor Cardiovascular: Grossly normal heart sounds.  Good peripheral circulation. Respiratory: Normal respiratory effort.  No retractions. Gastrointestinal: Soft and epigastric tenderness to palpation. No distention. Musculoskeletal: No obvious deformities Neurologic:  Normal speech and language. No gross focal neurologic deficits are appreciated. Skin:  Skin is warm and dry. No rash noted. Psychiatric: Mood and affect are normal. Speech and behavior are normal.  ____________________________________________   LABS (all labs ordered are listed, but only abnormal results are displayed)  Labs Reviewed  COMPREHENSIVE METABOLIC PANEL - Abnormal; Notable for the following components:      Result Value   Glucose, Bld 173 (*)    All other components within normal limits  CBC - Abnormal; Notable for the following components:   Platelets 418 (*)    All other components within normal limits  URINALYSIS, COMPLETE (UACMP) WITH MICROSCOPIC - Abnormal; Notable for the following components:   Color, Urine YELLOW (*)    APPearance HAZY (*)    Bacteria, UA RARE (*)    All other components within normal limits  LIPASE, BLOOD  POC URINE PREG, ED   RADIOLOGY  ED MD interpretation: Right upper quadrant ultrasound shows cholelithiasis with a 1.6 cm stone in the neck of the gallbladder  Official radiology report(s): 08/11/20 Abdomen Limited RUQ (LIVER/GB)  Result Date: 08/09/2020 CLINICAL DATA:  Right upper quadrant pain EXAM: ULTRASOUND ABDOMEN LIMITED RIGHT UPPER QUADRANT COMPARISON:  CT 03/17/2012 FINDINGS: Gallbladder: Multiple gallstones and sludge within the gallbladder. 1.6 cm stone in the neck of the gallbladder Common bile  duct: Diameter: Normal caliber, 4 mm Liver: No focal lesion identified. Within normal limits in parenchymal echogenicity. Portal vein is patent on color Doppler imaging with normal direction of blood flow towards the liver. Other: None. IMPRESSION: Cholelithiasis. 1.6 cm stone lodged in the gallbladder neck. Gallbladder sludge. Electronically Signed   By: 03/19/2012 M.D.   On: 08/09/2020 02:45    ____________________________________________   PROCEDURES  Procedure(s) performed (including Critical Care):  .1-3 Lead EKG Interpretation  Date/Time: 08/09/2020 5:59 AM Performed by: 08/11/2020, MD Authorized by: Merwyn Katos, MD     Interpretation: normal     ECG rate:  72   ECG rate assessment: normal     Rhythm: sinus rhythm     Ectopy: none     Conduction: normal     ____________________________________________   INITIAL IMPRESSION / ASSESSMENT AND PLAN / ED COURSE  As part of my medical decision making, I reviewed the following data within the electronic MEDICAL RECORD NUMBER Nursing notes reviewed and incorporated, Labs reviewed, EKG interpreted, Old chart reviewed, Radiograph reviewed and Notes from prior ED visits reviewed and incorporated  Presentation most consistent with biliary colic, without evidence of infection. History and exam AAA, pancreatitis, SBO, appendicitis, mesenteric ischemia, nephrolithiasis, pyelonephritis, or diverticulitis.  ED Interventions: analgesia PRN ED Workup: CBC, BMP, LFTs, Lipase, Abdominal US Consults: General surgery-Dr. Claudine Mouton.  Recommends p.o. challenge after Zofran and follow-up in his office next Tuesday Pain controlled and patient is p.o. tolerant prior to discharge Disposition: Refer to general surgery outpatient. Discharge home with SRP and instructions for primary care follow up within 24-48 hours.      ____________________________________________   FINAL CLINICAL IMPRESSION(S) / ED DIAGNOSES  Final diagnoses:   RUQ abdominal pain  Right upper quadrant abdominal pain  Non-intractable vomiting with nausea, unspecified vomiting type  Calculus of gallbladder without cholecystitis without obstruction     ED Discharge Orders          Ordered    dicyclomine (BENTYL) 10 MG capsule  4 times daily        08/09/20 0500    ondansetron (ZOFRAN ODT) 4 MG disintegrating tablet  Every 8 hours PRN        08/09/20 0500             Note:  This document was prepared using Dragon voice recognition software and may include unintentional dictation errors.    Merwyn Katos, MD 08/09/20 0600

## 2020-08-13 ENCOUNTER — Encounter: Payer: Self-pay | Admitting: Surgery

## 2020-08-13 ENCOUNTER — Ambulatory Visit (INDEPENDENT_AMBULATORY_CARE_PROVIDER_SITE_OTHER): Payer: Medicaid Other | Admitting: Surgery

## 2020-08-13 ENCOUNTER — Telehealth: Payer: Self-pay | Admitting: Surgery

## 2020-08-13 ENCOUNTER — Ambulatory Visit: Payer: Self-pay | Admitting: Surgery

## 2020-08-13 ENCOUNTER — Other Ambulatory Visit: Payer: Self-pay

## 2020-08-13 VITALS — BP 118/75 | HR 102 | Temp 98.1°F | Ht 63.0 in | Wt 251.6 lb

## 2020-08-13 DIAGNOSIS — K8010 Calculus of gallbladder with chronic cholecystitis without obstruction: Secondary | ICD-10-CM

## 2020-08-13 NOTE — Patient Instructions (Addendum)
Our surgery scheduler Barbara will call you within 24-48 hours to get you scheduled. If you have not heard from her after 48 hours, please call our office. You will not need to get Covid tested before surgery and have the blue sheet available when she calls to write down important information.   If you have any concerns or questions, please feel free to call our office.   Minimally Invasive Cholecystectomy  Minimally invasive cholecystectomy is surgery to remove the gallbladder. The gallbladder is a pear-shaped organ that lies beneath the liver on the right side of the body. The gallbladder stores bile, which is a fluid that helps the body digest fats. Cholecystectomy is often done to treat inflammation of the gallbladder (cholecystitis). This condition is usually caused by a buildup of gallstones (cholelithiasis) in the gallbladder. Gallstones can block the flow of bile, which can result in inflammation and pain. In severe cases, emergency surgery may be required. This procedure is done though small incisions in the abdomen, instead of one large incision. It is also called laparoscopic surgery. A thin scope with a camera (laparoscope) is inserted through one incision. Then surgical instruments are inserted through the other incisions. In some cases, a minimally invasive surgery may need to be changed to a surgery that is done through a larger incision. This is called open surgery. Tell a health care provider about: Any allergies you have. All medicines you are taking, including vitamins, herbs, eye drops, creams, and over-the-counter medicines. Any problems you or family members have had with anesthetic medicines. Any blood disorders you have. Any surgeries you have had. Any medical conditions you have. Whether you are pregnant or may be pregnant. What are the risks? Generally, this is a safe procedure. However, problems may occur, including: Infection. Bleeding. Allergic reactions to  medicines. Damage to nearby structures or organs. A stone remaining in the common bile duct. The common bile duct carries bile from the gallbladder into the small intestine. A bile leak from the cyst duct that is clipped when your gallbladder is removed. What happens before the procedure? Staying hydrated Follow instructions from your health care provider about hydration, which may include: Up to 2 hours before the procedure - you may continue to drink clear liquids, such as water, clear fruit juice, black coffee, and plain tea.  Eating and drinking restrictions Follow instructions from your health care provider about eating and drinking, which may include: 8 hours before the procedure - stop eating heavy meals or foods, such as meat, fried foods, or fatty foods. 6 hours before the procedure - stop eating light meals or foods, such as toast or cereal. 6 hours before the procedure - stop drinking milk or drinks that contain milk. 2 hours before the procedure - stop drinking clear liquids. Medicines Ask your health care provider about: Changing or stopping your regular medicines. This is especially important if you are taking diabetes medicines or blood thinners. Taking medicines such as aspirin and ibuprofen. These medicines can thin your blood. Do not take these medicines unless your health care provider tells you to take them. Taking over-the-counter medicines, vitamins, herbs, and supplements. General instructions Let your health care provider know if you develop a cold or an infection before surgery. Plan to have someone take you home from the hospital or clinic. If you will be going home right after the procedure, plan to have someone with you for 24 hours. Ask your health care provider: How your surgery site will be marked.   be taken to help prevent infection. These may include: Removing hair at the surgery site. Washing skin with a germ-killing soap. Taking  antibiotic medicine. What happens during the procedure?  An IV will be inserted into one of your veins. You will be given one or both of the following: A medicine to help you relax (sedative). A medicine to make you fall asleep (general anesthetic). A breathing tube will be placed in your mouth. Your surgeon will make several small incisions in your abdomen. The laparoscope will be inserted through one of the small incisions. The camera on the laparoscope will send images to a monitor in the operating room. This lets your surgeon see inside your abdomen. A gas will be pumped into your abdomen. This will expand your abdomen to give the surgeon more room to perform the surgery. Other tools that are needed for the procedure will be inserted through the other incisions. The gallbladder will be removed through one of the incisions. Your common bile duct may be examined. If stones are found in the common bile duct, they may be removed. After your gallbladder has been removed, the incisions will be closed with stitches (sutures), staples, or skin glue. Your incisions may be covered with a bandage (dressing). The procedure may vary among health care providers and hospitals. What happens after the procedure? Your blood pressure, heart rate, breathing rate, and blood oxygen level will be monitored until you leave the hospital or clinic. You will be given medicines as needed to control your pain. If you were given a sedative during the procedure, it can affect you for several hours. Do not drive or operate machinery until your health care provider says that it is safe. Summary Minimally invasive cholecystectomy, also called laparoscopic cholecystectomy, is surgery to remove the gallbladder using small incisions. Tell your health care provider about all the medical conditions you have and all the medicines you are taking for those conditions. Before the procedure, follow instructions about eating or  drinking restrictions and changing or stopping medicines. If you were given a sedative during the procedure, it can affect you for several hours. Do not drive or operate machinery until your health care provider says that it is safe. This information is not intended to replace advice given to you by your health care provider. Make sure you discuss any questions you have with your healthcare provider. Document Revised: 11/14/2018 Document Reviewed: 11/14/2018 Elsevier Patient Education  2022 [employer]    Gallbladder Eating Plan If you have a gallbladder condition, you may have trouble digesting fats. Eating a low-fat diet can help reduce your symptoms, and may be helpful before and after having surgery to remove your gallbladder (cholecystectomy). Your health care provider may recommend that you work with a diet and nutrition specialist (dietitian) to help you reduce the amount of fat in your diet. What are tips for following this plan? General guidelines Limit your fat intake to less than 30% of your total daily calories. If you eat around 1,800 calories each day, this is less than 60 grams (g) of fat per day. Fat is an important part of a healthy diet. Eating a low-fat diet can make it hard to maintain a healthy body weight. Ask your dietitian how much fat, calories, and other nutrients you need each day. Eat small, frequent meals throughout the day instead of three large meals. Drink at least 8-10 cups of fluid a day. Drink enough fluid to keep your urine clear or pale yellow.  Limit alcohol intake to no more than 1 drink a day for nonpregnant women and 2 drinks a day for men. One drink equals 12 oz of beer, 5 oz of wine, or 1 oz of hard liquor. Reading food labels  Check Nutrition Facts on food labels for the amount of fat per serving. Choose foods with less than 3 grams of fat per serving.  Shopping Choose nonfat and low-fat healthy foods. Look for the words "nonfat," "low fat," or  "fat free." Avoid buying processed or prepackaged foods. Cooking Cook using low-fat methods, such as baking, broiling, grilling, or boiling. Cook with small amounts of healthy fats, such as olive oil, grapeseed oil, canola oil, or sunflower oil. What foods are recommended? All fresh, frozen, or canned fruits and vegetables. Whole grains. Low-fat or non-fat (skim) milk and yogurt. Lean meat, skinless poultry, fish, eggs, and beans. Low-fat protein supplement powders or drinks. Spices and herbs. What foods are not recommended? High-fat foods. These include baked goods, fast food, fatty cuts of meat, ice cream, french toast, sweet rolls, pizza, cheese bread, foods covered with butter, creamy sauces, or cheese. Fried foods. These include french fries, tempura, battered fish, breaded chicken, fried breads, and sweets. Foods with strong odors. Foods that cause bloating and gas. Summary A low-fat diet can be helpful if you have a gallbladder condition, or before and after gallbladder surgery. Limit your fat intake to less than 30% of your total daily calories. This is about 60 g of fat if you eat 1,800 calories each day. Eat small, frequent meals throughout the day instead of three large meals. This information is not intended to replace advice given to you by your health care provider. Make sure you discuss any questions you have with your healthcare provider. Document Revised: 09/28/2019 Document Reviewed: 09/28/2019 Elsevier Patient Education  2022 [employer].

## 2020-08-13 NOTE — Progress Notes (Signed)
Patient ID: Kristin Evans, female   DOB: 1990/12/24, 30 y.o.   MRN: [id number]  Chief Complaint: Gallstones  History of Present Illness Kristin Evans is a 30 y.o. female with a multiyear history of postprandial/middle of the night upper abdominal pain and associated vomiting.  Significant weight loss obtained with dieting.  Recent pregnancy recurrence of abdominal pain.  Pain is typically under the right breast.  She would be nauseated for days.  During episodes of pain she would feel the chills.  She denies fever.  She would often awaken during the night currently with vomiting, no history of reflux.  Avoiding late evening eating.  Recent visit to ED for unrelenting pain revealed large gallstones, normal LFTs and white blood cell count.  She is well acquainted with food she needs to avoid, and currently indicates that her pain is a 1 or 2 out of 10.  Past Medical History Past Medical History:  Diagnosis Date   Diabetes mellitus without complication (HCC)    Obesity       History reviewed. No pertinent surgical history.  Allergies  Allergen Reactions   Pravastatin Diarrhea    Current Outpatient Medications  Medication Sig Dispense Refill   acetaminophen (TYLENOL) 325 MG tablet Take 2 tablets (650 mg total) by mouth every 4 (four) hours as needed (for pain scale < 4  OR  temperature  >/=  100.5 F).     metFORMIN (GLUCOPHAGE-XR) 500 MG 24 hr tablet Take 1 tablet (500 mg total) by mouth daily with breakfast. 30 tablet 11   Prenatal Vit-Fe Fumarate-FA (MULTIVITAMIN-PRENATAL) 27-0.8 MG TABS tablet Take 1 tablet by mouth daily at 12 noon.     No current facility-administered medications for this visit.    Family History History reviewed. No pertinent family history.    Social History Social History   Tobacco Use   Smoking status: Former    Pack years: 0.00   Smokeless tobacco: Never  Substance Use Topics   Alcohol use: No   Drug use: No        Review of  Systems  Constitutional:  Positive for chills. Negative for fever.  HENT: Negative.    Eyes: Negative.   Respiratory: Negative.    Cardiovascular: Negative.   Gastrointestinal:  Positive for abdominal pain, nausea and vomiting. Negative for heartburn and melena.  Genitourinary: Negative.   Skin: Negative.   Neurological: Negative.   Psychiatric/Behavioral: Negative.       Physical Exam Blood pressure 118/75, pulse (!) 102, temperature 98.1 F (36.7 C), temperature source Oral, height 5\' 3"  (1.6 m), weight 251 lb 9.6 oz (114.1 kg), last menstrual period 07/29/2020, SpO2 97 %, not currently breastfeeding. Last Weight  Most recent update: 08/13/2020 11:23 AM    Weight  114.1 kg (251 lb 9.6 oz)             CONSTITUTIONAL: Well developed, and nourished, appropriately responsive and aware without distress.  Morbidly obese. EYES: Sclera non-icteric.   EARS, NOSE, MOUTH AND THROAT: Mask worn.   The oropharynx is clear. Oral mucosa is pink and moist.     Hearing is intact to voice.  NECK: Trachea is midline, and there is no jugular venous distension.  LYMPH NODES:  Lymph nodes in the neck are not enlarged. RESPIRATORY:  Lungs are clear, and breath sounds are equal bilaterally. Normal respiratory effort without pathologic use of accessory muscles. CARDIOVASCULAR: Heart is regular in rate and rhythm. GI: The abdomen is soft, nontender,  and nondistended. There were no palpable masses. I did not appreciate hepatosplenomegaly. There were normal bowel sounds. MUSCULOSKELETAL:  Symmetrical muscle tone appreciated in all four extremities.    SKIN: Skin turgor is normal. No pathologic skin lesions appreciated.  NEUROLOGIC:  Motor and sensation appear grossly normal.  Cranial nerves are grossly without defect. PSYCH:  Alert and oriented to person, place and time. Affect is appropriate for situation.  Data Reviewed I have personally reviewed what is currently available of the patient's imaging,  recent labs and medical records.   Labs:  CBC Latest Ref Rng & Units 08/09/2020 05/08/2020 05/07/2020  WBC 4.0 - 10.5 K/uL 8.0 12.3(H) 10.5  Hemoglobin 12.0 - 15.0 g/dL 30.0 11.3(L) 12.0  Hematocrit 36.0 - 46.0 % 38.8 33.2(L) 35.5(L)  Platelets 150 - 400 K/uL 418(H) 274 311   CMP Latest Ref Rng & Units 08/09/2020 05/08/2020 05/07/2020  Glucose 70 - 99 mg/dL 923(R) 007(M) 226(J)  BUN 6 - 20 mg/dL 19 9 11   Creatinine 0.44 - 1.00 mg/dL 3.35 4.56  Sodium 135 - 145 mmol/L 138 136 135  Potassium 3.5 - 5.1 mmol/L 3.8 3.6 3.6  Chloride 98 - 111 mmol/L 105 107 106  CO2 22 - 32 mmol/L 27 22 20(L)  Calcium 8.9 - 10.3 mg/dL 9.1 9.2 9.4  Total Protein 6.5 - 8.1 g/dL 7.8 5.9(L) 6.8  Total Bilirubin 0.3 - 1.2 mg/dL 0.6 0.4 0.5  Alkaline Phos 38 - 126 U/L 55 97 112  AST 15 - 41 U/L 20 17 16   ALT 0 - 44 U/L 29 8 9       Imaging: Radiology review:  CLINICAL DATA:  Right upper quadrant pain   EXAM: ULTRASOUND ABDOMEN LIMITED RIGHT UPPER QUADRANT   COMPARISON:  CT 03/17/2012   FINDINGS: Gallbladder:   Multiple gallstones and sludge within the gallbladder. 1.6 cm stone in the neck of the gallbladder   Common bile duct:   Diameter: Normal caliber, 4 mm   Liver:   No focal lesion identified. Within normal limits in parenchymal echogenicity. Portal vein is patent on color Doppler imaging with normal direction of blood flow towards the liver.   Other: None.   IMPRESSION: Cholelithiasis. 1.6 cm stone lodged in the gallbladder neck. Gallbladder sludge.     Electronically Signed   By: M.D.   On: 08/09/2020 02:45 Within last 24 hrs: No results found.  Assessment    Chronic calculus cholecystitis. Patient Active Problem List   Diagnosis Date Noted   NSVD (normal spontaneous vaginal delivery) 05/09/2020   Gestational hypertension 05/09/2020   PCOS (polycystic ovarian syndrome) 05/07/2020   Type 2 diabetes mellitus not at goal [facility]) 04/11/2016    Plan    Robotic  cholecystectomy. The risks, benefits, potential complications, alternative treatment options, evaluations and diagnoses, and likely outcomes were discussed in detail with the patient. The possibility of anesthetic complications, bleeding, infection, finding a normal appearing gallbladder, trauma to adjacent organs, or injury to surrounding structures, bile leak or obstruction,  the need for additional procedures, reaction to medication, pulmonary aspiration, the possible need to convert to an open procedure, and issues requiring transfusion or further operations were discussed with the patient. Questions sought, and answered to satisfaction.  No guarantees were ever spoken or implied.  The patient and/or family concurred with the proposed plan, and gave informed consent.    Face-to-face time spent with the patient and accompanying care providers(if present) was 30 minutes, with more than 50% of the time  spent counseling, educating, and coordinating care of the patient.    These notes generated with voice recognition software. I apologize for typographical errors.  Campbell Lerner M.D., FACS 08/13/2020, 11:43 AM

## 2020-08-13 NOTE — Telephone Encounter (Signed)
Outgoing call is made, left message for patient to call.  Please advise patient of Pre-Admission date/time, COVID Testing date and Surgery date.  Surgery Date: 09/04/20 Preadmission Testing Date: 08/28/20 (phone 1p-5p) Covid Testing Date: Not needed.     Also patient to call at [phone], between 1-3:00pm the day before surgery, to find out what time to arrive for surgery.

## 2020-08-14 NOTE — Telephone Encounter (Signed)
Outgoing call is made again.  Patient is now aware of all dates and information provided regarding her surgery.  Patient verbalized understanding.

## 2020-08-17 ENCOUNTER — Other Ambulatory Visit: Payer: Self-pay

## 2020-08-17 ENCOUNTER — Emergency Department
Admission: EM | Admit: 2020-08-17 | Discharge: 2020-08-17 | Disposition: A | Discharge status: Home / Self Care | Payer: Medicaid Other | Attending: Student in an Organized Health Care Education/Training Program | Admitting: Student in an Organized Health Care Education/Training Program

## 2020-08-17 DIAGNOSIS — Z5321 Procedure and treatment not carried out due to patient leaving prior to being seen by health care provider: Secondary | ICD-10-CM | POA: Diagnosis not present

## 2020-08-17 DIAGNOSIS — R1110 Vomiting, unspecified: Secondary | ICD-10-CM | POA: Diagnosis present

## 2020-08-17 DIAGNOSIS — R109 Unspecified abdominal pain: Secondary | ICD-10-CM | POA: Insufficient documentation

## 2020-08-17 MED ORDER — ONDANSETRON 4 MG PO TBDP
4.0000 mg | ORAL_TABLET | Freq: Once | ORAL | Status: AC | PRN
  Administered 2020-08-17: 4 mg via ORAL
  Filled 2020-08-17: qty 1

## 2020-08-17 NOTE — ED Triage Notes (Addendum)
Was here last week and dx with '2 fist sized gallstones in my duct,' has cholecystectomy scheduled 7/13. Reports pain worsened 2.5hrs ago with vomiting after eating salad, denies fevers

## 2020-08-28 ENCOUNTER — Inpatient Hospital Stay: Admission: RE | Admit: 2020-08-28 | Payer: Medicaid Other | Source: Ambulatory Visit

## 2020-08-30 ENCOUNTER — Inpatient Hospital Stay: Admission: RE | Admit: 2020-08-30 | Payer: Medicaid Other | Source: Ambulatory Visit

## 2020-08-30 ENCOUNTER — Telehealth: Payer: Self-pay | Admitting: Surgery

## 2020-08-30 NOTE — Telephone Encounter (Signed)
08/30/20: Pre admissions called, stating they have been trying to get in touch with the patient to do her pre -admit today.  I was able to get in touch with the mother today who stated her daughter was with her and that on June 30th the daughter went to [facility] and had emergency surgery done there.  They never called to let us know of this.  At their request surgery for 09/04/20 with Dr. Claudine Mouton is cancelled.

## 2020-09-04 ENCOUNTER — Ambulatory Visit: Admission: RE | Admit: 2020-09-04 | Payer: Medicaid Other | Source: Home / Self Care | Admitting: Surgery

## 2020-09-04 ENCOUNTER — Encounter: Admission: RE | Payer: Self-pay | Source: Home / Self Care

## 2020-09-04 SURGERY — CHOLECYSTECTOMY, ROBOT-ASSISTED, LAPAROSCOPIC
Anesthesia: General

## 2020-09-15 ENCOUNTER — Emergency Department
Admission: EM | Admit: 2020-09-15 | Discharge: 2020-09-15 | Disposition: A | Discharge status: Home / Self Care | Payer: Medicaid Other | Attending: Emergency Medicine | Admitting: Emergency Medicine

## 2020-09-15 ENCOUNTER — Encounter: Payer: Self-pay | Admitting: Emergency Medicine

## 2020-09-15 ENCOUNTER — Other Ambulatory Visit: Payer: Self-pay

## 2020-09-15 ENCOUNTER — Emergency Department: Payer: Medicaid Other

## 2020-09-15 DIAGNOSIS — E119 Type 2 diabetes mellitus without complications: Secondary | ICD-10-CM | POA: Insufficient documentation

## 2020-09-15 DIAGNOSIS — Z87891 Personal history of nicotine dependence: Secondary | ICD-10-CM | POA: Diagnosis not present

## 2020-09-15 DIAGNOSIS — Z3A01 Less than 8 weeks gestation of pregnancy: Secondary | ICD-10-CM | POA: Insufficient documentation

## 2020-09-15 DIAGNOSIS — Z7984 Long term (current) use of oral hypoglycemic drugs: Secondary | ICD-10-CM | POA: Insufficient documentation

## 2020-09-15 DIAGNOSIS — O200 Threatened abortion: Secondary | ICD-10-CM | POA: Diagnosis present

## 2020-09-15 DIAGNOSIS — N939 Abnormal uterine and vaginal bleeding, unspecified: Secondary | ICD-10-CM

## 2020-09-15 LAB — COMPREHENSIVE METABOLIC PANEL
ALT: 18 U/L (ref 0–44)
AST: 17 U/L (ref 15–41)
Albumin: 3.9 g/dL (ref 3.5–5.0)
Alkaline Phosphatase: 59 U/L (ref 38–126)
Anion gap: 8 (ref 5–15)
BUN: 16 mg/dL (ref 6–20)
CO2: 25 mmol/L (ref 22–32)
Calcium: 8.8 mg/dL — ABNORMAL LOW (ref 8.9–10.3)
Chloride: 102 mmol/L (ref 98–111)
Creatinine, Ser: 0.71 mg/dL (ref 0.44–1.00)
GFR, Estimated: >60 mL/min (ref >60)
Glucose, Bld: 181 mg/dL — ABNORMAL HIGH (ref 70–99)
Potassium: 3.9 mmol/L (ref 3.5–5.1)
Sodium: 135 mmol/L (ref 135–145)
Total Bilirubin: 0.5 mg/dL (ref 0.3–1.2)
Total Protein: 7.6 g/dL (ref 6.5–8.1)

## 2020-09-15 LAB — URINALYSIS, COMPLETE (UACMP) WITH MICROSCOPIC
Bilirubin Urine: NEGATIVE
Glucose, UA: NEGATIVE mg/dL
Ketones, ur: NEGATIVE mg/dL
Nitrite: NEGATIVE
Protein, ur: 30 mg/dL — AB
RBC / HPF: >50 RBC/hpf — ABNORMAL HIGH (ref 0–5)
Specific Gravity, Urine: 1.021 (ref 1.005–1.030)
WBC, UA: >50 WBC/hpf — ABNORMAL HIGH (ref 0–5)
pH: 9 — ABNORMAL HIGH (ref 5.0–8.0)

## 2020-09-15 LAB — ABO/RH: ABO/RH(D): A POS

## 2020-09-15 LAB — CBC
HCT: 39.2 % (ref 36.0–46.0)
Hemoglobin: 12.9 g/dL (ref 12.0–15.0)
MCH: 28.5 pg (ref 26.0–34.0)
MCHC: 32.9 g/dL (ref 30.0–36.0)
MCV: 86.7 fL (ref 80.0–100.0)
Platelets: 382 10*3/uL (ref 150–400)
RBC: 4.52 MIL/uL (ref 3.87–5.11)
RDW: 13.2 % (ref 11.5–15.5)
WBC: 7.5 10*3/uL (ref 4.0–10.5)
nRBC: 0 % (ref 0.0–0.2)

## 2020-09-15 LAB — HCG, QUANTITATIVE, PREGNANCY: hCG, Beta Chain, Quant, S: 3176 m[IU]/mL — ABNORMAL HIGH (ref <5)

## 2020-09-15 LAB — LIPASE, BLOOD: Lipase: 31 U/L (ref 11–51)

## 2020-09-15 NOTE — ED Provider Notes (Signed)
[facility] Emergency Department Provider Note  Time seen: 10:52 AM  I have reviewed the triage vital signs and the nursing notes.   HISTORY  Chief Complaint Abdominal Pain, Vaginal Bleeding, and Possible Pregnancy   HPI Kristin Evans is a 30 y.o. female with a past medical history of diabetes, G2, P1 4 months ago, presents to the emergency department for vaginal bleeding and abdominal cramping.  According to the patient she is approximately [redacted] weeks pregnant by LMP, over the past 2 days she has had mild vaginal spotting which increased today where she is having mild to moderate bleeding consistent with her menstrual cycle.  Patient states lower abdominal cramping.  No significant pain per patient.  No history of miscarriage or abortion previously.  Patient states she gave birth approximately 4 months ago.   Past Medical History:  Diagnosis Date   Diabetes mellitus without complication (HCC)    Obesity     Patient Active Problem List   Diagnosis Date Noted   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   NSVD (normal spontaneous vaginal delivery) 05/09/2020   Gestational hypertension 05/09/2020   PCOS (polycystic ovarian syndrome) 05/07/2020   Type 2 diabetes mellitus not at goal [facility]) 04/11/2016    History reviewed. No pertinent surgical history.  Prior to Admission medications   Medication Sig Start Date End Date Taking? Authorizing Provider  acetaminophen (TYLENOL) 325 MG tablet Take 2 tablets (650 mg total) by mouth every 4 (four) hours as needed (for pain scale < 4  OR  temperature  >/=  100.5 F). 02/19/20   Gustavo Lah, CNM  metFORMIN (GLUCOPHAGE-XR) 500 MG 24 hr tablet Take 1 tablet (500 mg total) by mouth daily with breakfast. 05/10/20 05/10/21  Haroldine Laws, CNM  Prenatal Vit-Fe Fumarate-FA (MULTIVITAMIN-PRENATAL) 27-0.8 MG TABS tablet Take 1 tablet by mouth daily at 12 noon.    [provider]    Allergies  Allergen  Reactions   Pravastatin Diarrhea    No family history on file.  Social History Social History   Tobacco Use   Smoking status: Former   Smokeless tobacco: Never  Substance Use Topics   Alcohol use: No   Drug use: No    Review of Systems Constitutional: Negative for fever. Cardiovascular: Negative for chest pain. Respiratory: Negative for shortness of breath. Gastrointestinal: Positive for lower abdominal cramping. Genitourinary: Positive for vaginal bleeding x3 days. Musculoskeletal: Negative for musculoskeletal complaints Neurological: Negative for headache All other ROS negative  ____________________________________________   PHYSICAL EXAM:  VITAL SIGNS: ED Triage Vitals  Enc Vitals Group     BP 09/15/20 0751 133/90     Pulse Rate 09/15/20 0751 96     Resp 09/15/20 0751 18     Temp 09/15/20 0751 98.8 F (37.1 C)     Temp Source 09/15/20 0751 Oral     SpO2 09/15/20 0751 99 %     Weight 09/15/20 0732 251 lb 5.2 oz (114 kg)     Height 09/15/20 0732 5\' 3"  (1.6 m)     Head Circumference --      Peak Flow --      Pain Score 09/15/20 0731 4     Pain Loc --      Pain Edu? --      Excl. in GC? --    Constitutional: Alert and oriented. Well appearing and in no distress. Eyes: Normal exam ENT      Head: Normocephalic and atraumatic.  Mouth/Throat: Mucous membranes are moist. Cardiovascular: Normal rate, regular rhythm. Respiratory: Normal respiratory effort without tachypnea nor retractions. Breath sounds are clear  Gastrointestinal: Soft, very slight suprapubic tenderness to palpation otherwise benign abdomen without rebound guarding or distention. Musculoskeletal: Nontender with normal range of motion in all extremities.  Neurologic:  Normal speech and language. No gross focal neurologic deficits  Skin:  Skin is warm, dry and intact.  Psychiatric: Mood and affect are normal.  ____________________________________________     RADIOLOGY   IMPRESSION:   Non-localization of the pregnancy on this scan. No intrauterine  gestational sac. Mildly thickened (11 mm) and heterogeneous  endometrium with no focal endometrial mass. No adnexal masses.  Sonographic differential diagnosis includes spontaneous abortion or  less likely occult ectopic gestation. Recommend close clinical  follow-up and serial serum beta HCG monitoring, with repeat  obstetric scan as warranted by beta HCG levels and clinical  assessment.   ____________________________________________   INITIAL IMPRESSION / ASSESSMENT AND PLAN / ED COURSE  Pertinent labs & imaging results that were available during my care of the patient were reviewed by me and considered in my medical decision making (see chart for details).   Patient presents emergency department approximately [redacted] weeks pregnant by LMP with vaginal bleeding and lower abdominal cramping.  Beta-hCG is low at 3000.  Ultrasound shows Localization of the pregnancy.  Differential at this time would include miscarriage versus less likely ectopic pregnancy versus less likely early normal pregnancy.  We will have the patient follow-up in 2 days with OB for repeat beta hCG.  Discussed return precautions.  Patient agreeable to plan of care.  Patient states very minimal discomfort/cramping currently.  [name] was evaluated in Emergency Department on 09/15/2020 for the symptoms described in the history of present illness. She was evaluated in the context of the global COVID-19 pandemic, which necessitated consideration that the patient might be at risk for infection with the SARS-CoV-2 virus that causes COVID-19. Institutional protocols and algorithms that pertain to the evaluation of patients at risk for COVID-19 are in a state of rapid change based on information released by regulatory bodies including the CDC and federal and state organizations. These policies and algorithms were followed during the patient's care in the  ED.  ____________________________________________   FINAL CLINICAL IMPRESSION(S) / ED DIAGNOSES  Threatened miscarriage   Minna Antis, MD 09/15/20 1054

## 2020-09-15 NOTE — Discharge Instructions (Addendum)
Please follow-up with OB/GYN in 2 days for recheck of your pregnancy hormone level.  Today's pregnancy hormone level (beta-hCG) is 3100.  Return to the emergency department for any significant abdominal pain, significant bleeding, or any other symptom personally concerning to yourself.

## 2020-09-15 NOTE — ED Triage Notes (Signed)
Pt reports is [redacted] weeks pregnant and started with vaginal bleeding and cramping since last night.

## 2020-09-16 LAB — URINE CULTURE

## 2020-11-04 ENCOUNTER — Ambulatory Visit
Admission: EM | Admit: 2020-11-04 | Discharge: 2020-11-04 | Disposition: A | Discharge status: Home / Self Care | Payer: Medicaid Other | Attending: Emergency Medicine | Admitting: Emergency Medicine

## 2020-11-04 ENCOUNTER — Other Ambulatory Visit: Payer: Self-pay

## 2020-11-04 ENCOUNTER — Encounter: Payer: Self-pay | Admitting: Emergency Medicine

## 2020-11-04 DIAGNOSIS — Z1152 Encounter for screening for COVID-19: Secondary | ICD-10-CM | POA: Diagnosis not present

## 2020-11-04 DIAGNOSIS — J22 Unspecified acute lower respiratory infection: Secondary | ICD-10-CM

## 2020-11-04 LAB — POCT RAPID STREP A (OFFICE): Rapid Strep A Screen: NEGATIVE

## 2020-11-04 MED ORDER — CEFDINIR 300 MG PO CAPS: 300.0000 mg | ORAL_CAPSULE | Freq: Two times a day (BID) | ORAL | 0 refills | Status: AC

## 2020-11-04 MED ORDER — ALBUTEROL SULFATE HFA 108 (90 BASE) MCG/ACT IN AERS: 1.0000 | INHALATION_SPRAY | Freq: Four times a day (QID) | RESPIRATORY_TRACT | 0 refills | Status: DC | PRN

## 2020-11-04 NOTE — Discharge Instructions (Addendum)
DO NOT START YOUR ANTIBIOTIC (CEFDINIR) UNTIL YOUR COVID TEST COMES BACK NEGATIVE  We will call you with any positive results from your COVID-19 testing completed in clinic today.  If you do not receive a phone call from [place] within the next 2-3 days, check your MyChart for up-to-date health information related to testing completed in clinic today.   Take antibiotics (Cefdinir) as directed. Do not stop taking them just because you feel better. You need to take the full course of antibiotics. Rest, push lots of fluids (especially water), and utilize supportive care for symptoms. Breathe warm, moist area from a steamy shower, hot bath, or sink filled with hot water.  Avoid cold, dry air.  Using a humidifier in your home may help.  Follow the directions for cleaning the machine. Put a hot, wet towel or a warm gel pack on your face 3-4 times a day for 5-10 minutes each time. You may take acetaminophen (Tylenol) every 4-6 hours and ibuprofen every 6-8 hours for muscle pain, joint pain, headaches (you may also alternate these medications). Sudafed (pseudophedrine) is sold behind the counter and can help reduce nasal pressure; avoid taking this if you have high blood pressure or feel jittery. Sudafed PE (phenylephrine) can be a helpful, short-term, over-the-counter alternative to limit side effects or if you have high blood pressure.  Flonase nasal spray can help alleviate congestion and sinus pressure. Many patients choose Afrin as a nasal decongestant; do not use for more than 3 days for risk of rebound (increased symptoms after stopping medication).  Saline nasal sprays or rinses can also help nasal congestion (use bottled or sterile water). Warm tea with lemon and honey can sooth sore throat and cough, as can cough drops.   Return to clinic for new or worse swelling or redness in your face or around your eyes, or if you have a new or higher fever.

## 2020-11-04 NOTE — ED Provider Notes (Signed)
CHIEF COMPLAINT:   Chief Complaint  Patient presents with   Cough   Nasal Congestion   Fever     SUBJECTIVE/HPI:  HPI A very pleasant 30 y.o.Female presents today with cough, nasal drainage, and chest congestion for the last week. Patient does not report any shortness of breath, chest pain, palpitations, visual changes, weakness, tingling, headache, nausea, vomiting, diarrhea, chills.   has a past medical history of Diabetes mellitus without complication (HCC) and Obesity.  ROS:  Review of Systems See Subjective/HPI Medications, Allergies and Problem List personally reviewed in Epic today OBJECTIVE:   Vitals:   11/04/20 0947  BP: 115/81  Pulse: 100  Resp: 20  Temp: 98.8 F (37.1 C)    Physical Exam   General: Appears well-developed and well-nourished. No acute distress.  HEENT Head: Normocephalic and atraumatic.   Ears: Hearing grossly intact, no drainage or visible deformity.  Nose: No nasal deviation.  Erythematous and congested nasal mucosa noted bilaterally with rhinorrhea Mouth/Throat: No stridor or tracheal deviation.  Mildly erythematous posterior pharynx noted with clear drainage present.  No white patchy exudate noted. Eyes: Conjunctivae and EOM are normal. No eye drainage or scleral icterus bilaterally.  Neck: Normal range of motion, neck is supple.  Cardiovascular: Normal rate. Regular rhythm; no murmurs, gallops, or rubs.  Pulm/Chest: No respiratory distress.  Left lower lobe rhonchi with mild inspiratory and expiratory wheezing.  Left upper lobe, right upper lobe and right lower lobes CTA.  Constant forceful cough noted. Neurological: Alert and oriented to person, place, and time.  Skin: Skin is warm and dry.  No rashes, lesions, abrasions or bruising noted to skin.   Psychiatric: Normal mood, affect, behavior, and thought content.   Vital signs and nursing note reviewed.   Patient stable and cooperative with examination. PROCEDURES:     LABS/X-RAYS/EKG/MEDS:   Results for orders placed or performed during the hospital encounter of 11/04/20  POCT rapid strep A  Result Value Ref Range   Rapid Strep A Screen Negative Negative    MEDICAL DECISION MAKING:   Patient presents with cough, nasal drainage, and chest congestion for the last week. Patient does not report any shortness of breath, chest pain, palpitations, visual changes, weakness, tingling, headache, nausea, vomiting, diarrhea, chills.  Chart review completed.  Rapid strep negative.  COVID-19 pending.  Given assessment findings and history of immunocompromising conditions along with length of symptoms, likely lower respiratory tract infection.  Did advise the patient to hold on using her antibiotics until she receives a negative COVID test for which was obtained in clinic today.  Did Rx cefdinir and albuterol to the patient's preferred pharmacy and advised about home treatment and care to include rest, steam, Tylenol versus ibuprofen, Mucinex.  Return for any new or worse swelling or redness in her face, around her eyes or new high fever.  Patient verbalized understanding and agreed with treatment plan.  Patient stable upon discharge. ASSESSMENT/PLAN:  1. Encounter for screening for COVID-19 - Novel Coronavirus, NAA ([facility]); Standing - Novel Coronavirus, NAA ([facility])  2. Lower respiratory tract infection - cefdinir (OMNICEF) 300 MG capsule; Take 1 capsule (300 mg total) by mouth 2 (two) times daily for 10 days.  Dispense: 20 capsule; Refill: 0 - albuterol (VENTOLIN HFA) 108 (90 Base) MCG/ACT inhaler; Inhale 1-2 puffs into the lungs every 6 (six) hours as needed (cough).  Dispense: 6.7 g; Refill: 0 Instructions about new medications and side effects provided.  Plan:   Discharge Instructions  DO NOT START YOUR ANTIBIOTIC (CEFDINIR) UNTIL YOUR COVID TEST COMES BACK NEGATIVE  We will call you with any positive results from your COVID-19 testing completed  in clinic today.  If you do not receive a phone call from [place] within the next 2-3 days, check your MyChart for up-to-date health information related to testing completed in clinic today.   Take antibiotics (Cefdinir) as directed. Do not stop taking them just because you feel better. You need to take the full course of antibiotics. Rest, push lots of fluids (especially water), and utilize supportive care for symptoms. Breathe warm, moist area from a steamy shower, hot bath, or sink filled with hot water.  Avoid cold, dry air.  Using a humidifier in your home may help.  Follow the directions for cleaning the machine. Put a hot, wet towel or a warm gel pack on your face 3-4 times a day for 5-10 minutes each time. You may take acetaminophen (Tylenol) every 4-6 hours and ibuprofen every 6-8 hours for muscle pain, joint pain, headaches (you may also alternate these medications). Sudafed (pseudophedrine) is sold behind the counter and can help reduce nasal pressure; avoid taking this if you have high blood pressure or feel jittery. Sudafed PE (phenylephrine) can be a helpful, short-term, over-the-counter alternative to limit side effects or if you have high blood pressure.  Flonase nasal spray can help alleviate congestion and sinus pressure. Many patients choose Afrin as a nasal decongestant; do not use for more than 3 days for risk of rebound (increased symptoms after stopping medication).  Saline nasal sprays or rinses can also help nasal congestion (use bottled or sterile water). Warm tea with lemon and honey can sooth sore throat and cough, as can cough drops.   Return to clinic for new or worse swelling or redness in your face or around your eyes, or if you have a new or higher fever.          Amalia Greenhouse, FNP 11/04/20 1014

## 2020-11-04 NOTE — ED Triage Notes (Signed)
Pt here with cough, sore throat, nasal drainage and chest congestion for 1 week.

## 2020-11-05 LAB — NOVEL CORONAVIRUS, NAA: SARS-CoV-2, NAA: NOT DETECTED

## 2020-11-05 LAB — SARS-COV-2, NAA 2 DAY TAT

## 2020-12-21 ENCOUNTER — Ambulatory Visit
Admission: EM | Admit: 2020-12-21 | Discharge: 2020-12-21 | Disposition: A | Discharge status: Home / Self Care | Payer: Medicaid Other | Attending: Emergency Medicine | Admitting: Emergency Medicine

## 2020-12-21 ENCOUNTER — Other Ambulatory Visit: Payer: Self-pay

## 2020-12-21 DIAGNOSIS — B3731 Acute candidiasis of vulva and vagina: Secondary | ICD-10-CM | POA: Insufficient documentation

## 2020-12-21 DIAGNOSIS — N898 Other specified noninflammatory disorders of vagina: Secondary | ICD-10-CM | POA: Diagnosis not present

## 2020-12-21 DIAGNOSIS — R81 Glycosuria: Secondary | ICD-10-CM | POA: Insufficient documentation

## 2020-12-21 LAB — POCT URINALYSIS DIP (MANUAL ENTRY)
Bilirubin, UA: NEGATIVE
Glucose, UA: 500 mg/dL — AB
Leukocytes, UA: NEGATIVE
Nitrite, UA: NEGATIVE
Protein Ur, POC: NEGATIVE mg/dL
Spec Grav, UA: 1.025 (ref 1.010–1.025)
Urobilinogen, UA: 0.2 E.U./dL
pH, UA: 5.5 (ref 5.0–8.0)

## 2020-12-21 LAB — POCT FASTING CBG KUC MANUAL ENTRY: POCT Glucose (KUC): 305 mg/dL — AB (ref 70–99)

## 2020-12-21 MED ORDER — FLUCONAZOLE 150 MG PO TABS: 150.0000 mg | ORAL_TABLET | Freq: Every day | ORAL | 0 refills | Status: AC

## 2020-12-21 NOTE — ED Triage Notes (Signed)
Patient presents to Urgent Care with complaints of vaginal discharge, dysuria, and irritation x 1 month. Pt states she has been treating symptoms with monostat.   Denies fever, hematuria, abdominal pain, or flank pain.

## 2020-12-21 NOTE — ED Provider Notes (Signed)
SUBJECTIVE:  Kristin Evans is a very pleasant 30 y.o. Female presents with concerns due to vaginal discharge, dysuria, vaginal irritation, and itching for one month. Has been treating symptoms with Monistat.  ROS: General/Constitutional: No fever, chills, or sweats GI: No abdominal pain, nausea/vomiting or diarrhea GU: No urinary frequency or dysuria Genitalia: As above Lymph: No swelling, red streaks or swollen lymph nodes Skin: No rashes or skin lesions   OBJECTIVE: Vitals:   12/21/20 0848  BP: 117/69  Pulse: 81  Resp: 16  Temp: 98.2 F (36.8 C)  SpO2: 98%     General: Appears well-developed and well-nourished. No acute distress.  Cardiovascular: Normal rate Pulm/Chest: No respiratory distress Neurological: Alert and oriented to person, place, and time.  Skin: Skin is warm and dry.  Psychiatric: Normal mood, affect, behavior, and thought content.  GU:  Deferred secondary to self-collect specimen   Laboratory:  Orders Placed This Encounter  Procedures   POCT urinalysis dipstick   POCT CBG (manual entry)   Results for orders placed or performed during the hospital encounter of 12/21/20  POCT urinalysis dipstick  Result Value Ref Range   Color, UA yellow yellow   Clarity, UA cloudy (A) clear   Glucose, UA =500 (A) negative mg/dL   Bilirubin, UA negative negative   Ketones, POC UA large (80) (A) negative mg/dL   Spec Grav, UA 1.324 4.010 - 1.025   Blood, UA trace-lysed (A) negative   pH, UA 5.5 5.0 - 8.0   Protein Ur, POC negative negative mg/dL   Urobilinogen, UA 0.2 0.2 or 1.0 E.U./dL   Nitrite, UA Negative Negative   Leukocytes, UA Negative Negative  POCT CBG (manual entry)  Result Value Ref Range   POCT Glucose ([facility]) 305 (A) 70 - 99 mg/dL     Assessment: 1. Vaginal yeast infection - Cervicovaginal ancillary only; Standing - Cervicovaginal ancillary only - fluconazole (DIFLUCAN) 150 MG tablet; Take 1 tablet (150 mg total) by mouth daily for 1  day.  Dispense: 2 tablet; Refill: 0  2. Glucosuria - POCT CBG (manual entry); Standing - POCT CBG (manual entry)  Plan:  If new medications were prescribed and/or administered during this visit Instructions about new medications and side effects provided.  If any changes in chronic medications then new reconciled medication list is given to patient.    MDM: Patient presents with vaginal discharge, dysuria, vaginal irritation, and itching for one month. Has been treating symptoms with Monistat.  Urinalysis reveals cloudy urine, 500 glucose, large ketones, trace blood. Low suspicion for UTI. Glucose: 305.  Given symptoms, likely vaginal yeast infection.  Rx'd Diflucan to the patient's preferred pharmacy and advised to take her first tablet today and her second tablet within the next 3 to 5 days if her symptoms do not improve as expected.  Advised to return to clinic with any unexpected vaginal bleeding or if there is any new or increased pain in her pelvis.  Follow-up with gynecology if her symptoms become recurrent.  Patient verbalized understanding and agreed with plan.  Patient stable upon discharge.  Instructions:    Discharge Instructions      Take your first Diflucan today and your second Diflucan in the next 3 to 5 days if symptoms do not improve.  A vaginal yeast infection is caused by too many yeast cells in the vagina. This is common in women of all ages. Itching, vaginal discharge and irritation, and other symptoms can bother you. But yeast infections don't often  cause other health problems.  Some medicines can increase your risk of getting a yeast infection. These include antibiotics, birth control pills, hormones, and steroids. You may also be more likely to get a yeast infection if you are pregnant, have diabetes, douche, or wear tight clothes.  With treatment, most yeast infections get better in 2 to 3 days.  Do not use tampons while using a vaginal cream or suppository. The  tampons can absorb the medicine. Use pads instead. Wear loose cotton clothing. Do not wear nylon or other fabric that holds body heat and moisture close to the skin. Try sleeping without underwear. Do not scratch. Relieve itching with a cold pack or a cool bath. Do not wash your vaginal area more than once a day. Use plain water or a mild, unscented soap. Air-dry the vaginal area. Change out of wet swimsuits after swimming. Do not have sex until you have finished your treatment. Do not douche  Return to clinic if you have unexpected vaginal bleeding or if there is new or increased pain in your pelvis. Follow-up with your gynecologist if you have symptoms that return after treatment or if you have recurrent infections.           Kristin Evans, [place] 12/21/20 [phone]

## 2020-12-21 NOTE — Discharge Instructions (Signed)
Take your first Diflucan today and your second Diflucan in the next 3 to 5 days if symptoms do not improve.  A vaginal yeast infection is caused by too many yeast cells in the vagina. This is common in women of all ages. Itching, vaginal discharge and irritation, and other symptoms can bother you. But yeast infections don't often cause other health problems.  Some medicines can increase your risk of getting a yeast infection. These include antibiotics, birth control pills, hormones, and steroids. You may also be more likely to get a yeast infection if you are pregnant, have diabetes, douche, or wear tight clothes.  With treatment, most yeast infections get better in 2 to 3 days.  Do not use tampons while using a vaginal cream or suppository. The tampons can absorb the medicine. Use pads instead. Wear loose cotton clothing. Do not wear nylon or other fabric that holds body heat and moisture close to the skin. Try sleeping without underwear. Do not scratch. Relieve itching with a cold pack or a cool bath. Do not wash your vaginal area more than once a day. Use plain water or a mild, unscented soap. Air-dry the vaginal area. Change out of wet swimsuits after swimming. Do not have sex until you have finished your treatment. Do not douche  Return to clinic if you have unexpected vaginal bleeding or if there is new or increased pain in your pelvis. Follow-up with your gynecologist if you have symptoms that return after treatment or if you have recurrent infections.   

## 2020-12-23 LAB — CERVICOVAGINAL ANCILLARY ONLY
Bacterial Vaginitis (gardnerella): NEGATIVE
Candida Glabrata: NEGATIVE
Candida Vaginitis: POSITIVE — AB
Chlamydia: NEGATIVE
Comment: NEGATIVE
Comment: NEGATIVE
Comment: NEGATIVE
Comment: NEGATIVE
Comment: NEGATIVE
Comment: NORMAL
Neisseria Gonorrhea: NEGATIVE
Trichomonas: NEGATIVE

## 2021-01-01 ENCOUNTER — Ambulatory Visit: Payer: Self-pay | Admitting: *Deleted

## 2021-01-01 ENCOUNTER — Other Ambulatory Visit: Payer: Self-pay

## 2021-01-01 ENCOUNTER — Emergency Department
Admission: EM | Admit: 2021-01-01 | Discharge: 2021-01-01 | Disposition: A | Discharge status: Home / Self Care | Payer: Medicaid Other | Attending: Emergency Medicine | Admitting: Emergency Medicine

## 2021-01-01 DIAGNOSIS — Z87891 Personal history of nicotine dependence: Secondary | ICD-10-CM | POA: Insufficient documentation

## 2021-01-01 DIAGNOSIS — Z7984 Long term (current) use of oral hypoglycemic drugs: Secondary | ICD-10-CM | POA: Insufficient documentation

## 2021-01-01 DIAGNOSIS — E1165 Type 2 diabetes mellitus with hyperglycemia: Secondary | ICD-10-CM | POA: Insufficient documentation

## 2021-01-01 DIAGNOSIS — R739 Hyperglycemia, unspecified: Secondary | ICD-10-CM

## 2021-01-01 LAB — CBC
HCT: 43.8 % (ref 36.0–46.0)
Hemoglobin: 14.6 g/dL (ref 12.0–15.0)
MCH: 28.6 pg (ref 26.0–34.0)
MCHC: 33.3 g/dL (ref 30.0–36.0)
MCV: 85.9 fL (ref 80.0–100.0)
Platelets: 365 10*3/uL (ref 150–400)
RBC: 5.1 MIL/uL (ref 3.87–5.11)
RDW: 12.1 % (ref 11.5–15.5)
WBC: 5.3 10*3/uL (ref 4.0–10.5)
nRBC: 0 % (ref 0.0–0.2)

## 2021-01-01 LAB — URINALYSIS, ROUTINE W REFLEX MICROSCOPIC
Bilirubin Urine: NEGATIVE
Glucose, UA: >=500 mg/dL — AB
Ketones, ur: 5 mg/dL — AB
Nitrite: NEGATIVE
Protein, ur: NEGATIVE mg/dL
Specific Gravity, Urine: 1.027 (ref 1.005–1.030)
pH: 6 (ref 5.0–8.0)

## 2021-01-01 LAB — BASIC METABOLIC PANEL
Anion gap: 11 (ref 5–15)
BUN: 11 mg/dL (ref 6–20)
CO2: 22 mmol/L (ref 22–32)
Calcium: 9.1 mg/dL (ref 8.9–10.3)
Chloride: 99 mmol/L (ref 98–111)
Creatinine, Ser: 0.71 mg/dL (ref 0.44–1.00)
GFR, Estimated: >60 mL/min (ref >60)
Glucose, Bld: 579 mg/dL (ref 70–99)
Potassium: 4.4 mmol/L (ref 3.5–5.1)
Sodium: 132 mmol/L — ABNORMAL LOW (ref 135–145)

## 2021-01-01 LAB — CBG MONITORING, ED
Glucose-Capillary: 589 mg/dL (ref 70–99)
Glucose-Capillary: 289 mg/dL — ABNORMAL HIGH (ref 70–99)

## 2021-01-01 LAB — POC URINE PREG, ED: Preg Test, Ur: NEGATIVE

## 2021-01-01 MED ORDER — SODIUM CHLORIDE 0.9 % IV BOLUS
1000.0000 mL | Freq: Once | INTRAVENOUS | Status: AC
  Administered 2021-01-01: 1000 mL via INTRAVENOUS

## 2021-01-01 MED ORDER — FLUCONAZOLE 150 MG PO TABS: ORAL_TABLET | ORAL | 0 refills | Status: DC

## 2021-01-01 MED ORDER — INSULIN ASPART 100 UNIT/ML IJ SOLN
10.0000 [IU] | Freq: Once | INTRAMUSCULAR | Status: AC
  Administered 2021-01-01: 10 [IU] via INTRAVENOUS
  Filled 2021-01-01: qty 1

## 2021-01-01 MED ORDER — METFORMIN HCL 1000 MG PO TABS: 1000.0000 mg | ORAL_TABLET | Freq: Two times a day (BID) | ORAL | 2 refills | Status: DC

## 2021-01-01 NOTE — Progress Notes (Signed)
Inpatient Diabetes Program Recommendations  AACE/ADA: New Consensus Statement on Inpatient Glycemic Control (2015)  Target Ranges:  Prepandial:   less than 140 mg/dL      Peak postprandial:   less than 180 mg/dL (1-2 hours)      Critically ill patients:  140 - 180 mg/dL  Results for [name], [name] (MRN [id number]) as of 01/01/2021 09:53  Ref. Range 01/01/2021 09:00  Glucose Latest Ref Range: 70 - 99 mg/dL [facility])   To ED with Hyperglycemia--Has been without Meds for 7 months--Gave birth in March 2022--Has appt with PCP in December but has been experiencing increased thirst and urination--Called PCP office and told to come to the ED  History: DM2  Home DM Meds: Metformin 500 mg daily (NOT taking)   Given 2L NS IVF bolus in the ED  Per Chart Review, it looks like pt was taking Levemir and Humulin Regular insulin during her last pregnancy    Met w/ pt at bedside in the ED.  Pt told me she was taking Metformin 1000 mg BID prior to last pregnancy and this medication seemed to work well.  Started Levemir and Humulin Regular Insulin about 20 weeks into her pregnancy.  Insulin stopped after delivery and the MD changed/reduced her Metformin to 500 mg daily.  Pt told me she was still checking CBGs at home and noted her CBGs were in the 300s about 2 months after delivery.  Has been having CBGs 500-600 of recent and ran out of her Metformin about 2 months ago (end of August).  Has PCP appt with new PCP in [place] in December but told me she has been feeling poorly and noted her increased thirst and urination.  Co-worker encouraged her to come to the ED after she passed out at work.  Has CBG meter at home.  Will be getting full insurance coverage at her job in January.  Is open to taking insulin at home but would rather try restarting her Metformin back at her pre-pregnancy dose of 1000 mg BID and see how she does with this.  I strongly encouraged pt to seek care under an Endocrinolgist given her  age and uncontrolled CBGs.  Talked with RN and ED MD as well.  ED MD said he would like to see how pt's CBG responds after 2nd L of NS and then have pt restart her Metformin 1000 mg BID.  No plans to start insulin for home at this time.  Pt knows to follow up with PCP in December and can return to [facility] or ED if havs further issues with hyperglycemia.    --Will follow patient during hospitalization--  Wyn Quaker RN, MSN, CDE Diabetes Coordinator Inpatient Glycemic Control Team Team Pager: [phone] (8a-5p)

## 2021-01-01 NOTE — ED Triage Notes (Signed)
Pt to ED for hyperglycemia, states has been without meds for 7 months. Also reports yeast infection from elevated ketone levels.  Increased thirst and urination.

## 2021-01-01 NOTE — ED Notes (Signed)
Diabetes educator RN was at bedside with nurse.  Will recheck CBG once second 1L bolus complete.

## 2021-01-01 NOTE — Telephone Encounter (Signed)
Reason for Disposition  Blood glucose > 500 mg/dL (07.3 mmol/L)  Answer Assessment - Initial Assessment Questions 1. BLOOD GLUCOSE: "What is your blood glucose level?"      642 2. ONSET: "When did you check the blood glucose?"     Today- 7:45 3. USUAL RANGE: "What is your glucose level usually?" (e.g., usual fasting morning value, usual evening value)     300- normal range 4. KETONES: "Do you check for ketones (urine or blood test strips)?" If yes, ask: "What does the test show now?"      Not  5. TYPE 1 or 2:  "Do you know what type of diabetes you have?"  (e.g., Type 1, Type 2, Gestational; doesn't know)      Patient developed diabetes prior to pregnancy- type 2- no follow up since delivery 6. INSULIN: "Do you take insulin?" "What type of insulin(s) do you use? What is the mode of delivery? (syringe, pen; injection or pump)?"      Not taking 7. DIABETES PILLS: "Do you take any pills for your diabetes?" If yes, ask: "Have you missed taking any pills recently?"     Not taking 8. OTHER SYMPTOMS: "Do you have any symptoms?" (e.g., fever, frequent urination, difficulty breathing, dizziness, weakness, vomiting)     Frequent urination, vision changes 9. PREGNANCY: "Is there any chance you are pregnant?" "When was your last menstrual period?"     Na- LMP-ended yesterday  Protocols used: Diabetes - High Blood Sugar-A-AH

## 2021-01-01 NOTE — Discharge Instructions (Signed)
Please seek medical attention for any high fevers, chest pain, shortness of breath, change in behavior, persistent vomiting, bloody stool or any other new or concerning symptoms.  

## 2021-01-01 NOTE — ED Provider Notes (Signed)
[facility] Emergency Department Provider Note  ____________________________________________   I have reviewed the triage vital signs and the nursing notes.   HISTORY  Chief Complaint Hyperglycemia   History limited by: Not Limited   HPI Kristin Evans is a 30 y.o. female who presents to the emergency department today because of concerns for elevated blood sugar.  The patient states that she has history of diabetes.  Typically her sugar level is well controlled on metformin 1000 mg twice a day.  She did have a child 8 months ago.  During pregnancy she had her diabetic medication adjusted.  She states that about 2 months ago she ran out of her diabetes medication.  About a week after that she noticed that her blood sugar started becoming quite elevated.  She started feeling weak.  Started having increased thirst and urination.   Records reviewed. 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  Prior to Admission medications   Medication Sig Start Date End Date Taking? Authorizing Provider  acetaminophen (TYLENOL) 325 MG tablet Take 2 tablets (650 mg total) by mouth every 4 (four) hours as needed (for pain scale < 4  OR  temperature  >/=  100.5 F). 02/19/20   Gustavo Lah, CNM  albuterol (VENTOLIN HFA) 108 (90 Base) MCG/ACT inhaler Inhale 1-2 puffs into the lungs every 6 (six) hours as needed (cough). 11/04/20   Boddu, Belenda Cruise, FNP  metFORMIN (GLUCOPHAGE-XR) 500 MG 24 hr tablet Take 1 tablet (500 mg total)  by mouth daily with breakfast. 05/10/20 05/10/21  Haroldine Laws, CNM  Prenatal Vit-Fe Fumarate-FA (MULTIVITAMIN-PRENATAL) 27-0.8 MG TABS tablet Take 1 tablet by mouth daily at 12 noon.    [provider]    Allergies Pravastatin  Family History  Family history unknown: Yes    Social History Social History   Tobacco Use   Smoking status: Former   Smokeless tobacco: Never  Substance Use Topics   Alcohol use: No   Drug use: No    Review of Systems Constitutional: No fever/chills Eyes: Positive for blurred vision. ENT: No sore throat. Cardiovascular: Denies chest pain. Respiratory: Denies shortness of breath. Gastrointestinal: No abdominal pain.  No nausea, no vomiting.  No diarrhea.   Genitourinary: Positive for increased urination. Musculoskeletal: Negative for back pain. Skin: Negative for rash. Neurological: Negative for headaches, focal weakness or numbness.  ____________________________________________   PHYSICAL EXAM:  VITAL SIGNS: ED Triage Vitals  Enc Vitals Group     BP 01/01/21 0857 (!) 146/89     Pulse Rate 01/01/21 0857 93     Resp 01/01/21 0857 20     Temp 01/01/21 0857 97.6 F (36.4 C)     Temp Source 01/01/21 0857 Oral     SpO2 01/01/21 0857 95 %     Weight 01/01/21 0858 251 lb (113.9 kg)     Height 01/01/21 0858 5\' 3"  (1.6 m)     Head Circumference --      Peak Flow --  Pain Score 01/01/21 0858 0   Constitutional: Alert and oriented.  Eyes: Conjunctivae are normal.  ENT      Head: Normocephalic and atraumatic.      Nose: No congestion/rhinnorhea.      Mouth/Throat: Mucous membranes are moist.      Neck: No stridor. Hematological/Lymphatic/Immunilogical: No cervical lymphadenopathy. Cardiovascular: Normal rate, regular rhythm.  No murmurs, rubs, or gallops.  Respiratory: Normal respiratory effort without tachypnea nor retractions. Breath sounds are clear and equal bilaterally. No wheezes/rales/rhonchi. Gastrointestinal: Soft  and non tender. No rebound. No guarding.  Genitourinary: Deferred Musculoskeletal: Normal range of motion in all extremities. No lower extremity edema. Neurologic:  Normal speech and language. No gross focal neurologic deficits are appreciated.  Skin:  Skin is warm, dry and intact. No rash noted. Psychiatric: Mood and affect are normal. Speech and behavior are normal. Patient exhibits appropriate insight and judgment.  ____________________________________________    LABS (pertinent positives/negatives)  CBG 589 -> 289 CBC wbc 5.3, hgb 14.6, plt 365 BMP na 132, glu 579 otherwise wnl ____________________________________________   EKG  None  ____________________________________________    RADIOLOGY  None  ____________________________________________   PROCEDURES  Procedures  ____________________________________________   INITIAL IMPRESSION / ASSESSMENT AND PLAN / ED COURSE  Pertinent labs & imaging results that were available during my care of the patient were reviewed by me and considered in my medical decision making (see chart for details).   Patient presented to the emergency department today because of concerns for high blood sugar levels.  Patient has been out of her medication for about 2 months.  Patient's blood work without an elevated anion gap.  At this time I do have low suspicion for DKA.  Patient was given IV fluids and some IV insulin here with improving sugar level.  Will plan on placing patient back on her metformin 1000 mg twice daily.  Will give patient endocrinology follow-up information.  ____________________________________________   FINAL CLINICAL IMPRESSION(S) / ED DIAGNOSES  Final diagnoses:  Hyperglycemia     Note: This dictation was prepared with Dragon dictation. Any transcriptional errors that result from this process are unintentional     Phineas Semen, MD 01/01/21 1330

## 2021-01-01 NOTE — ED Notes (Addendum)
See triage note. Pt ambulatory to ED room, CBG 589, gave birth in March 2022 and metformin dose had been changed in pregnancy, unable to access primary care since 3/22 to follow up on medication regimen for DM, has appt scheduled in Dec but has been experiencing increased thirst (drinks "2 gallons/day" water) and increased urination, she called primary care clinic to get earlier appt and they told her to come to ED.  Pt in NAD at this time. Labs already drawn and IV in place. Water provided per request.

## 2021-01-01 NOTE — Telephone Encounter (Signed)
Patient is calling with glucose level elevated 642 this morning. Patient has had uncontrolled glucose since she delivered her child in March and has not had follow up care. Patient is having symptoms: dizziness, shaking, blurred vision, frequency of urination. Patient advised ED for evaluation and treatment.

## 2021-01-01 NOTE — ED Notes (Signed)
EDP at bedside talking with pt. CBG recheck was 289 after 2L bolus and 10 units IV aspart.

## 2021-01-02 LAB — URINE CULTURE

## 2021-01-13 ENCOUNTER — Emergency Department
Admission: EM | Admit: 2021-01-13 | Discharge: 2021-01-13 | Disposition: A | Discharge status: Home / Self Care | Payer: Medicaid Other | Attending: Emergency Medicine | Admitting: Emergency Medicine

## 2021-01-13 ENCOUNTER — Other Ambulatory Visit: Payer: Self-pay

## 2021-01-13 DIAGNOSIS — E1165 Type 2 diabetes mellitus with hyperglycemia: Secondary | ICD-10-CM | POA: Insufficient documentation

## 2021-01-13 DIAGNOSIS — Z7984 Long term (current) use of oral hypoglycemic drugs: Secondary | ICD-10-CM | POA: Diagnosis not present

## 2021-01-13 DIAGNOSIS — Z87891 Personal history of nicotine dependence: Secondary | ICD-10-CM | POA: Diagnosis not present

## 2021-01-13 DIAGNOSIS — R739 Hyperglycemia, unspecified: Secondary | ICD-10-CM

## 2021-01-13 LAB — CBG MONITORING, ED: Glucose-Capillary: 287 mg/dL — ABNORMAL HIGH (ref 70–99)

## 2021-01-13 MED ORDER — SODIUM CHLORIDE 0.9 % IV BOLUS: 1000.0000 mL | Freq: Once | INTRAVENOUS | Status: DC

## 2021-01-13 NOTE — ED Provider Notes (Signed)
[facility] Emergency Department Provider Note   ____________________________________________    I have reviewed the triage vital signs and the nursing notes.   HISTORY  Chief Complaint Hyperglycemia     HPI Kristin Evans is a 30 y.o. female with a history of type 2 diabetes who presents with complaints of elevated glucose.  Patient reports she checked her sugar this morning and it read as "high ".  Overall she was feeling at her baseline.  She took her metformin as prescribed and presented to the emergency department.  No abdominal pain, no fevers or chills.  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 No pertinent surgical history.  Prior to Admission medications   Medication Sig Start Date End Date Taking? Authorizing Provider  acetaminophen (TYLENOL) 325 MG tablet Take 2 tablets (650 mg total) by mouth every 4 (four) hours as needed (for pain scale < 4  OR  temperature  >/=  100.5 F). 02/19/20   Gustavo Lah, CNM  albuterol (VENTOLIN HFA) 108 (90 Base) MCG/ACT inhaler Inhale 1-2 puffs into the lungs every 6 (six) hours as needed (cough). 11/04/20   Amalia Greenhouse, FNP  fluconazole (DIFLUCAN) 150 MG tablet Take 1 tablet every 3 days for 3 doses 01/01/21   Phineas Semen, MD  metFORMIN (GLUCOPHAGE) 1000 MG tablet Take 1 tablet (1,000 mg total) by mouth 2 (two) times daily with a meal. 01/01/21 01/01/22  Phineas Semen, MD  metFORMIN (GLUCOPHAGE-XR) 500 MG 24 hr tablet Take 1 tablet (500 mg total) by mouth daily with breakfast. 05/10/20 05/10/21  Haroldine Laws,  CNM  Prenatal Vit-Fe Fumarate-FA (MULTIVITAMIN-PRENATAL) 27-0.8 MG TABS tablet Take 1 tablet by mouth daily at 12 noon.    [provider]     Allergies Pravastatin  Family History  Family history unknown: Yes    Social History Social History   Tobacco Use   Smoking status: Former   Smokeless tobacco: Never  Substance Use Topics   Alcohol use: No   Drug use: No    Review of Systems  Constitutional: No fever/chills  ENT: No increased thirst   Gastrointestinal: No abdominal pain.  No nausea, no vomiting.   Genitourinary: Negative for dysuria. Musculoskeletal: Negative for back pain. Skin: Negative for rash. Neurological: Negative for headaches     ____________________________________________   PHYSICAL EXAM:  VITAL SIGNS: ED Triage Vitals [01/13/21 0857]  Enc Vitals Group     BP 115/65     Pulse Rate 87     Resp 20     Temp 98.1 F (36.7 C)     Temp Source Oral     SpO2 98 %     Weight      Height      Head Circumference      Peak Flow      Pain Score      Pain Loc      Pain Edu?      Excl. in GC?      Constitutional: Alert and oriented. No acute distress. Pleasant and interactive Eyes: Conjunctivae are normal.  Head: Atraumatic.  Nose: No congestion/rhinnorhea. Mouth/Throat: Mucous membranes are moist.   Cardiovascular: Normal rate, regular rhythm.  Respiratory: Normal respiratory effort.  No retractions. Genitourinary: deferred Musculoskeletal: No lower extremity tenderness nor edema.   Neurologic:  Normal speech and language. No gross focal neurologic deficits are appreciated.   Skin:  Skin is warm, dry and intact. No rash noted.   ____________________________________________   LABS (all labs ordered are listed, but only abnormal results are displayed)  Labs Reviewed  CBG MONITORING, ED - Abnormal; Notable for the following components:      Result Value   Glucose-Capillary 287 (*)    All other components within normal limits   CBG MONITORING, ED  POC URINE PREG, ED   ____________________________________________  EKG   ____________________________________________  RADIOLOGY  None ____________________________________________   PROCEDURES  Procedure(s) performed: No  Procedures   Critical Care performed: No ____________________________________________   INITIAL IMPRESSION / ASSESSMENT AND PLAN / ED COURSE  Pertinent labs & imaging results that were available during my care of the patient were reviewed by me and considered in my medical decision making (see chart for details).   Patient presents with concerns for elevated glucose, fingerstick glucose here is 287, vitals are normal, no tachycardia, no physical complaints or symptoms  Given that patient is feeling well and is asymptomatic, glucose has improved no indication for further work-up at this time, she has outpatient follow-up with her PCP.   ____________________________________________   FINAL CLINICAL IMPRESSION(S) / ED DIAGNOSES  Final diagnoses:  Hyperglycemia      NEW MEDICATIONS STARTED DURING THIS VISIT:  New Prescriptions   No medications on file     Note:  This document was prepared using Dragon voice recognition software and may include unintentional dictation errors.    Jene Every, MD 01/13/21 [phone]

## 2021-01-13 NOTE — ED Notes (Signed)
Pt states CBG has gone down since waking up this am. Refuses blood work. States needs a work note. Informed will have to see provider for work note.

## 2021-01-13 NOTE — ED Triage Notes (Signed)
Pt states her glucose was ready "HIGH" on her meter at home today, states she take metformin 1000mg  BID. C/o increased thirst.

## 2021-02-04 ENCOUNTER — Ambulatory Visit: Payer: Self-pay | Admitting: Family Medicine

## 2021-02-04 ENCOUNTER — Telehealth: Payer: Self-pay

## 2021-02-04 NOTE — Telephone Encounter (Signed)
Kristin Evans called in and  spoke with the pec about her appointment today she was told that we could not reach her on the phone to confirm her visit with dr Ashley Royalty and she said that she could not get to her phone and she did not get a chance to set up her voice mail messages. She mention that she took off December 14 and was not aware that her appointment was today and demanded to be seen on December 14 which she said it was initially suppose to be. I explained to Kristin Evans that she was a no show today and that if we had a spot available we would accommodate her she mention that she takes metformin and is in need of it ASAP I advised her to go to the ER for her medicines if she thinks its an emergency. But in the meantime we will have to reschedule her appointment at the the end of December but before I can finish she hung up the phone.

## 2021-05-03 ENCOUNTER — Ambulatory Visit
Admission: EM | Admit: 2021-05-03 | Discharge: 2021-05-03 | Disposition: A | Discharge status: Home / Self Care | Payer: Medicaid Other | Attending: Emergency Medicine | Admitting: Emergency Medicine

## 2021-05-03 ENCOUNTER — Encounter: Payer: Self-pay | Admitting: Emergency Medicine

## 2021-05-03 ENCOUNTER — Other Ambulatory Visit: Payer: Self-pay

## 2021-05-03 DIAGNOSIS — N898 Other specified noninflammatory disorders of vagina: Secondary | ICD-10-CM | POA: Diagnosis not present

## 2021-05-03 DIAGNOSIS — O24111 Pre-existing diabetes mellitus, type 2, in pregnancy, first trimester: Secondary | ICD-10-CM | POA: Insufficient documentation

## 2021-05-03 DIAGNOSIS — Z3A01 Less than 8 weeks gestation of pregnancy: Secondary | ICD-10-CM | POA: Insufficient documentation

## 2021-05-03 DIAGNOSIS — O99211 Obesity complicating pregnancy, first trimester: Secondary | ICD-10-CM | POA: Insufficient documentation

## 2021-05-03 DIAGNOSIS — E119 Type 2 diabetes mellitus without complications: Secondary | ICD-10-CM | POA: Insufficient documentation

## 2021-05-03 DIAGNOSIS — O26891 Other specified pregnancy related conditions, first trimester: Secondary | ICD-10-CM | POA: Insufficient documentation

## 2021-05-03 DIAGNOSIS — E669 Obesity, unspecified: Secondary | ICD-10-CM | POA: Diagnosis not present

## 2021-05-03 NOTE — ED Triage Notes (Addendum)
Pt here with possible yeast infection, as pt is prone to them. Pt has white discharge and severe vaginal discomfort and itching x 2 weeks. Pt is [redacted] weeks pregnant ?

## 2021-05-03 NOTE — Discharge Instructions (Addendum)
Follow-up with your OB/GYN as discussed. 

## 2021-05-03 NOTE — ED Provider Notes (Signed)
?[facility] ? ? ? ?CSN: [id number] ?Arrival date & time: 05/03/21  1941 ? ? ?  ? ?History   ?Chief Complaint ?Chief Complaint  ?Patient presents with  ? Vaginal Itching  ? Vaginal Discharge  ? ? ?HPI ?Kristin Evans is a 31 y.o. female.   Patient is [redacted] weeks pregnant.  She presents with vaginal itching and white vaginal discharge x 2 weeks.  She states this is similar to previous episodes of yeast infection.  She denies fever, chills, abdominal pain, dysuria, hematuria, pelvic pain, or other symptoms.  Treatment attempted with OTC Monistat; she is on day 4 of 7 day treatment.  Her medical history includes diabetes, obesity, gestational hypertension, PCOS. ? ?The history is provided by the patient and medical records.  ? ?Past Medical History:  ?Diagnosis Date  ? Diabetes mellitus without complication (HCC)   ? Obesity   ? ? ?Patient Active Problem List  ? Diagnosis Date Noted  ? 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 NSVD (normal spontaneous vaginal delivery) 05/09/2020  ? Gestational hypertension 05/09/2020  ? PCOS (polycystic ovarian syndrome) 05/07/2020  ? Type 2 diabetes mellitus not at goal [facility]) 04/11/2016  ? ? ?History reviewed. No pertinent surgical history. ? ?OB History   ? ? Gravida  ?2  ? Para  ?1  ? Term  ?1  ? Preterm  ?   ? AB  ?   ? Living  ?1  ?  ? ? SAB  ?   ? IAB  ?   ? Ectopic  ?   ? Multiple  ?0  ? Live Births  ?1  ?   ?  ?  ? ? ? ?Home Medications   ? ?Prior to Admission medications   ?Medication Sig Start Date End Date Taking? Authorizing Provider  ?acetaminophen (TYLENOL) 325 MG tablet Take 2 tablets (650 mg total) by mouth every 4 (four) hours as needed (for pain scale < 4  OR  temperature  >/=  100.5 F). 02/19/20   Gustavo Lah, CNM  ?albuterol (VENTOLIN HFA) 108 (90 Base) MCG/ACT inhaler Inhale 1-2 puffs into the lungs every 6 (six) hours as needed (cough). 11/04/20   Amalia Greenhouse, FNP  ?fluconazole (DIFLUCAN) 150 MG tablet Take 1 tablet every 3 days for 3  doses 01/01/21   Phineas Semen, MD  ?metFORMIN (GLUCOPHAGE) 1000 MG tablet Take 1 tablet (1,000 mg total) by mouth 2 (two) times daily with a meal. 01/01/21 01/01/22  Phineas Semen, MD  ?metFORMIN (GLUCOPHAGE-XR) 500 MG 24 hr tablet Take 1 tablet (500 mg total) by mouth daily with breakfast. 05/10/20 05/10/21  Haroldine Laws, CNM  ?Prenatal Vit-Fe Fumarate-FA (MULTIVITAMIN-PRENATAL) 27-0.8 MG TABS tablet Take 1 tablet by mouth daily at 12 noon.    [provider]  ? ? ?Family History ?Family History  ?Family history unknown: Yes  ? ? ?Social History ?Social History  ? ?Tobacco Use  ? Smoking status: Former  ? Smokeless tobacco: Never  ?Substance Use Topics  ? Alcohol use: No  ? Drug use: No  ? ? ? ?Allergies   ?Pravastatin ? ? ?Review of Systems ?Review of Systems  ?Constitutional:  Negative for chills and fever.  ?Gastrointestinal:  Negative for abdominal pain and vomiting.  ?Genitourinary:  Positive for vaginal discharge. Negative for dysuria, flank pain, hematuria and pelvic pain.  ?Skin:  Negative for color change and rash.  ?All other systems reviewed and are negative. ? ? ?Physical Exam ?Triage Vital Signs ?  ED Triage Vitals  ?Enc Vitals Group  ?   BP   ?   Pulse   ?   Resp   ?   Temp   ?   Temp src   ?   SpO2   ?   Weight   ?   Height   ?   Head Circumference   ?   Peak Flow   ?   Pain Score   ?   Pain Loc   ?   Pain Edu?   ?   Excl. in GC?   ? ?No data found. ? ?Updated Vital Signs ?BP 123/74   Pulse 97   Temp 98.2 ?F (36.8 ?C)   Resp 18   LMP 03/14/2021 (Exact Date)   SpO2 97%   Breastfeeding Unknown  ? ?Visual Acuity ?Right Eye Distance:   ?Left Eye Distance:   ?Bilateral Distance:   ? ?Right Eye Near:   ?Left Eye Near:    ?Bilateral Near:    ? ?Physical Exam ?Vitals and nursing note reviewed.  ?Constitutional:   ?   General: She is not in acute distress. ?   Appearance: She is well-developed. She is obese. She is not ill-appearing.  ?HENT:  ?   Mouth/Throat:  ?   Mouth: Mucous membranes  are moist.  ?Cardiovascular:  ?   Rate and Rhythm: Normal rate and regular rhythm.  ?   Heart sounds: Normal heart sounds.  ?Pulmonary:  ?   Effort: Pulmonary effort is normal. No respiratory distress.  ?   Breath sounds: Normal breath sounds.  ?Abdominal:  ?   Palpations: Abdomen is soft.  ?   Tenderness: There is no abdominal tenderness. There is no right CVA tenderness, left CVA tenderness, guarding or rebound.  ?Musculoskeletal:  ?   Cervical back: Neck supple.  ?Skin: ?   General: Skin is warm and dry.  ?Neurological:  ?   Mental Status: She is alert.  ?Psychiatric:     ?   Mood and Affect: Mood normal.     ?   Behavior: Behavior normal.  ? ? ? ?UC Treatments / Results  ?Labs ?(all labs ordered are listed, but only abnormal results are displayed) ?Labs Reviewed  ?CERVICOVAGINAL ANCILLARY ONLY  ? ? ?EKG ? ? ?Radiology ?No results found. ? ?Procedures ?Procedures (including critical care time) ? ?Medications Ordered in UC ?Medications - No data to display ? ?Initial Impression / Assessment and Plan / UC Course  ?I have reviewed the triage vital signs and the nursing notes. ? ?Pertinent labs & imaging results that were available during my care of the patient were reviewed by me and considered in my medical decision making (see chart for details). ? ?  ?[redacted] weeks pregnant, Vaginal discharge, vaginal itching.  Patient obtained vaginal self swab for testing.  Discussed that we will call if test results are positive.  Instructed her to continue OTC Monistat as directed.  Instructed her to follow-up with her OB/GYN if her symptoms are not improving.  Patient agrees to plan of care.  ? ?Final Clinical Impressions(s) / UC Diagnoses  ? ?Final diagnoses:  ?Less than [redacted] weeks gestation of pregnancy  ?Vaginal discharge  ?Vaginal itching  ? ? ? ?Discharge Instructions   ? ?  ?Follow up with your OB/GYN as discussed.   ? ? ? ? ? ? ?ED Prescriptions   ?None ?  ? ?PDMP not reviewed this encounter. ?  ?Mickie Bail, NP ?05/03/21  0843 ? ?

## 2021-05-05 ENCOUNTER — Telehealth (HOSPITAL_COMMUNITY): Payer: Self-pay | Admitting: Emergency Medicine

## 2021-05-05 LAB — CERVICOVAGINAL ANCILLARY ONLY
Bacterial Vaginitis (gardnerella): NEGATIVE
Candida Glabrata: NEGATIVE
Candida Vaginitis: POSITIVE — AB
Chlamydia: NEGATIVE
Comment: NEGATIVE
Comment: NEGATIVE
Comment: NEGATIVE
Comment: NEGATIVE
Comment: NEGATIVE
Comment: NORMAL
Neisseria Gonorrhea: NEGATIVE
Trichomonas: NEGATIVE

## 2021-05-05 MED ORDER — CLOTRIMAZOLE 1 % VA CREA: 1.0000 | TOPICAL_CREAM | Freq: Every day | VAGINAL | 0 refills | Status: DC

## 2021-06-09 ENCOUNTER — Ambulatory Visit: Payer: Medicaid Other | Admitting: *Deleted

## 2021-10-15 ENCOUNTER — Ambulatory Visit (INDEPENDENT_AMBULATORY_CARE_PROVIDER_SITE_OTHER): Payer: Medicaid Other

## 2021-10-15 ENCOUNTER — Ambulatory Visit
Admission: EM | Admit: 2021-10-15 | Discharge: 2021-10-15 | Disposition: A | Discharge status: Home / Self Care | Payer: Medicaid Other | Attending: Emergency Medicine | Admitting: Emergency Medicine

## 2021-10-15 DIAGNOSIS — S6991XA Unspecified injury of right wrist, hand and finger(s), initial encounter: Secondary | ICD-10-CM | POA: Diagnosis not present

## 2021-10-15 DIAGNOSIS — S61332A Puncture wound without foreign body of right middle finger with damage to nail, initial encounter: Secondary | ICD-10-CM | POA: Diagnosis not present

## 2021-10-15 DIAGNOSIS — S61232A Puncture wound without foreign body of right middle finger without damage to nail, initial encounter: Secondary | ICD-10-CM

## 2021-10-15 DIAGNOSIS — W540XXA Bitten by dog, initial encounter: Secondary | ICD-10-CM | POA: Diagnosis not present

## 2021-10-15 MED ORDER — OXYCODONE HCL 5 MG PO TABS: 5.0000 mg | ORAL_TABLET | Freq: Four times a day (QID) | ORAL | 0 refills | Status: AC | PRN

## 2021-10-15 MED ORDER — AMOXICILLIN-POT CLAVULANATE 875-125 MG PO TABS: 1.0000 | ORAL_TABLET | Freq: Two times a day (BID) | ORAL | 0 refills | Status: DC

## 2021-10-15 NOTE — Discharge Instructions (Signed)
You are being treated for dog bite  X-rays negative for injury to the bone  Take Augmentin every morning and every evening for 7 days, this will prevent area from becoming infected  You may use Tylenol 500 to 1000 mg every 6 hours and/or ibuprofen 800 mg every 8 hours for management of discomfort, if ineffective you may use oxycodone as needed for severe pain, be mindful this medication may make you drowsy, use sparingly as you will only be dispensed 12 tablets  You may cleanse area daily with diluted soapy water during her normal hygiene, pat dry and then cover with a nonstick Band-Aid  You may use ice or heat over the affected area in 10 to 15-minute intervals for comfort  You may complete activity as tolerated  If the limitations in movement or pain with movement continue to persist you have been given information for orthopedic specialist for further evaluation and management

## 2021-10-15 NOTE — ED Triage Notes (Signed)
Pt injured right hand while grooming dog earlier. Also was bitten, dog is current on vaccines

## 2021-10-15 NOTE — ED Provider Notes (Signed)
[facility]    CSN: [id number] Arrival date & time: 10/15/21  1051      History   Chief Complaint Chief Complaint  Patient presents with   Hand Injury    HPI Kristin Evans is a 31 y.o. female.   Patient presents with right third and fourth finger pain and right wrist pain beginning today after dog bite at work.  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  Site is tender, erythematous but nondraining.  Bleeding has subsided.  Has wrapped hand and wrist with compression which has been helpful.  Cleanse area with peroxide and covered.  Painful to complete range of motion.  Denies numbness or tingling.  Dog is up-to-date on vaccinations.  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  OB History     Gravida  2   Para  1   Term  1   Preterm      AB      Living  1      SAB      IAB      Ectopic      Multiple  0   Live Births  1            Home Medications    Prior to Admission medications   Medication Sig Start Date End Date Taking? Authorizing Provider  amoxicillin-clavulanate (AUGMENTIN) 875-125 MG tablet Take 1 tablet by mouth every 12 (twelve) hours. 10/15/21  Yes Jeanette Rauth R, NP  oxyCODONE (ROXICODONE) 5 MG immediate release tablet Take 1 tablet (5 mg total) by mouth every 6 (six) hours as needed for up to 3 days for severe pain. 10/15/21 10/18/21 Yes Drequan Ironside, Elita Boone, NP  acetaminophen (TYLENOL) 325 MG tablet Take 2 tablets (650 mg total) by mouth every  4 (four) hours as needed (for pain scale < 4  OR  temperature  >/=  100.5 F). 02/19/20   Gustavo Lah, CNM  albuterol (VENTOLIN HFA) 108 (90 Base) MCG/ACT inhaler Inhale 1-2 puffs into the lungs every 6 (six) hours as needed (cough). 11/04/20   Boddu, Belenda Cruise, FNP  clotrimazole (GYNE-LOTRIMIN) 1 % vaginal cream Place 1 Applicatorful vaginally at bedtime. 05/05/21   Merrilee Jansky, MD  fluconazole (DIFLUCAN) 150 MG tablet Take 1 tablet every 3 days for 3 doses 01/01/21   Phineas Semen, MD  metFORMIN (GLUCOPHAGE) 1000 MG tablet Take 1 tablet (1,000 mg total) by mouth 2 (two) times daily with a meal. 01/01/21 01/01/22  Phineas Semen, MD  metFORMIN (GLUCOPHAGE-XR) 500 MG 24 hr tablet Take 1 tablet (500 mg total) by mouth daily with breakfast. 05/10/20 05/10/21  Haroldine Laws, CNM  Prenatal Vit-Fe Fumarate-FA (MULTIVITAMIN-PRENATAL) 27-0.8 MG TABS tablet Take 1 tablet by mouth daily at 12 noon.    [provider]    Family History Family History  Family history unknown: Yes    Social History Social History   Tobacco Use   Smoking status: Former   Smokeless tobacco:  Never  Substance Use Topics   Alcohol use: No   Drug use: No     Allergies   Pravastatin   Review of Systems Review of Systems  Constitutional: Negative.   Respiratory: Negative.    Cardiovascular: Negative.   Musculoskeletal:  Positive for myalgias. Negative for arthralgias, back pain, gait problem, joint swelling, neck pain and neck stiffness.  Skin:  Positive for wound. Negative for color change, pallor and rash.     Physical Exam Triage Vital Signs ED Triage Vitals  Enc Vitals Group     BP      Pulse      Resp      Temp      Temp src      SpO2      Weight      Height      Head Circumference      Peak Flow      Pain Score      Pain Loc      Pain Edu?      Excl. in [place]?    No data found.  Updated Vital Signs There were no vitals taken for this visit.  Visual Acuity Right Eye  Distance:   Left Eye Distance:   Bilateral Distance:    Right Eye Near:   Left Eye Near:    Bilateral Near:     Physical Exam Constitutional:      Appearance: Normal appearance.  HENT:     Head: Normocephalic.  Eyes:     Extraocular Movements: Extraocular movements intact.  Skin:    Comments: Puncture wound is present on the medial aspect of the third finger nailbed with erythema and tenderness present, bleeding control, nondraining, tenderness is throughout the entirety of the right third finger without point tenderness, limited range of motion due to pain with flexion, no deformity present, sensation intact, capillary refill less than 3, 2+ radial pulse  No Abnormality to the right wrist, range of motion intact, 2+ radial pulse  Neurological:     Mental Status: She is alert and oriented to person, place, and time. Mental status is at baseline.  Psychiatric:        Mood and Affect: Mood normal.        Behavior: Behavior normal.      UC Treatments / Results  Labs (all labs ordered are listed, but only abnormal results are displayed) Labs Reviewed - No data to display  EKG   Radiology DG Hand Complete Right  Result Date: 10/15/2021 CLINICAL DATA:  Injury EXAM: RIGHT HAND - COMPLETE 3+ VIEW COMPARISON:  Right hand 06/23/2012 FINDINGS: Normal alignment. No fracture. No arthropathy. Small exostosis distal second proximal phalanx unchanged. IMPRESSION: Negative. Electronically Signed   By: Marlan Palau M.D.   On: 10/15/2021 11:27    Procedures Procedures (including critical care time)  Medications Ordered in UC Medications - No data to display  Initial Impression / Assessment and Plan / UC Course  I have reviewed the triage vital signs and the nursing notes.  Pertinent labs & imaging results that were available during my care of the patient were reviewed by me and considered in my medical decision making (see chart for details).  Puncture wound of the right middle  finger  Dog up-to-date on vaccinations therefore will not need rabies series, x-ray of the hand is negative for injury to the bone, discussed with patient, wound cleansed in urgent care with chlorhexidine and wound cleanser, covered with a nonadherent dressing  and compression wrap applied for stability and support, prescribed Augmentin prophylactically and oxycodone for management of discomfort, advised to cleanse daily with diluted soapy water during normal hygiene, pat dry and cover with a nonstick Band-Aid until wound has closed, given walker referral to orthopedics if limitations with movement continue to persist past 7 days Final Clinical Impressions(s) / UC Diagnoses   Final diagnoses:  Puncture wound of right middle finger     Discharge Instructions      You are being treated for dog bite  X-rays negative for injury to the bone  Take Augmentin every morning and every evening for 7 days, this will prevent area from becoming infected  You may use Tylenol 500 to 1000 mg every 6 hours and/or ibuprofen 800 mg every 8 hours for management of discomfort, if ineffective you may use oxycodone as needed for severe pain, be mindful this medication may make you drowsy, use sparingly as you will only be dispensed 12 tablets  You may cleanse area daily with diluted soapy water during her normal hygiene, pat dry and then cover with a nonstick Band-Aid  You may use ice or heat over the affected area in 10 to 15-minute intervals for comfort  You may complete activity as tolerated  If the limitations in movement or pain with movement continue to persist you have been given information for orthopedic specialist for further evaluation and management   ED Prescriptions     Medication Sig Dispense Auth. Provider   amoxicillin-clavulanate (AUGMENTIN) 875-125 MG tablet Take 1 tablet by mouth every 12 (twelve) hours. 14 tablet Dhiren Azimi R, NP   oxyCODONE (ROXICODONE) 5 MG immediate release  tablet Take 1 tablet (5 mg total) by mouth every 6 (six) hours as needed for up to 3 days for severe pain. 12 tablet Ellasyn Swilling, Leitha Schuller, NP      I have reviewed the PDMP during this encounter.   Hans Eden, NP 10/15/21 1152

## 2022-01-04 IMAGING — US US OB < 14 WEEKS - US OB TV
1 series · 13 of 28 positions shown · non-contrast
Comparison: None.

CLINICAL DATA: 29-year-old pregnant female presents with heavy
vaginal bleeding for 1 day. EDC by LMP: 05/05/2020, projecting to an
expected gestational age of 6 weeks 6 days. Quantitative beta hCG
[DATE].

EXAM:
OBSTETRIC <14 WK US AND TRANSVAGINAL OB US
TECHNIQUE: Both transabdominal and transvaginal ultrasound examinations were
performed for complete evaluation of the gestation as well as the
maternal uterus, adnexal regions, and pelvic cul-de-sac.
Transvaginal technique was performed to assess early pregnancy.

[Series 1: us ob less than 14 weeks with ob transvaginal · 13 of 97 slices shown]
[im 4/97]
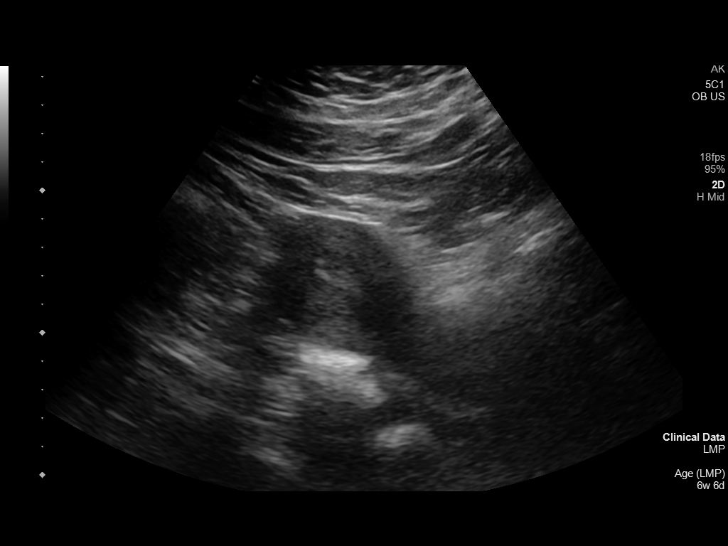
[im 11/97]
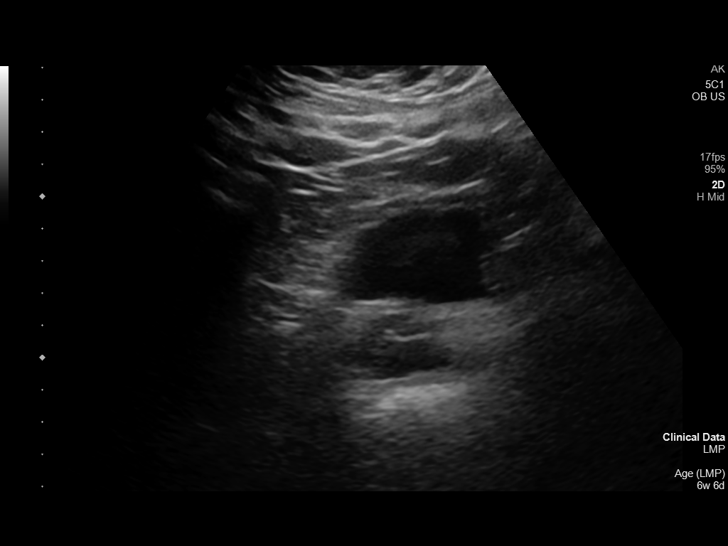
[im 18/97]
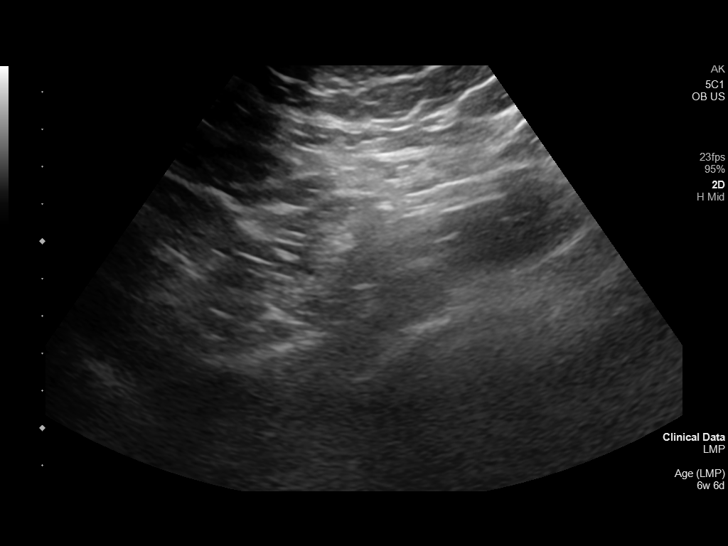
[im 25/97]
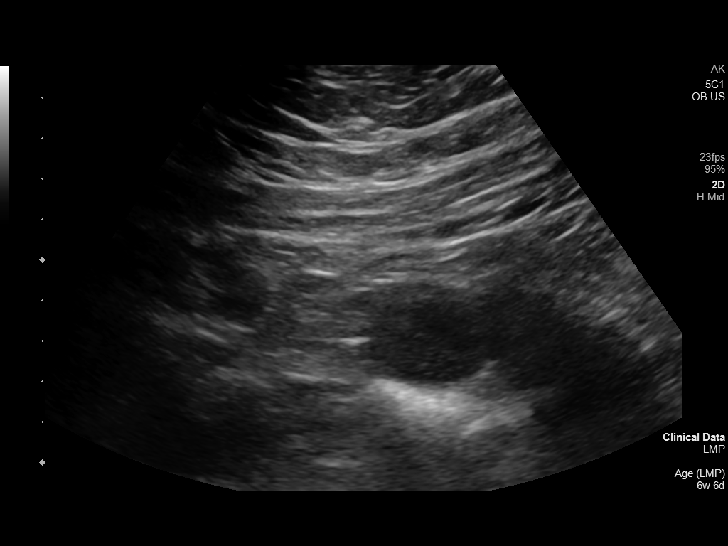
[im 33/97]
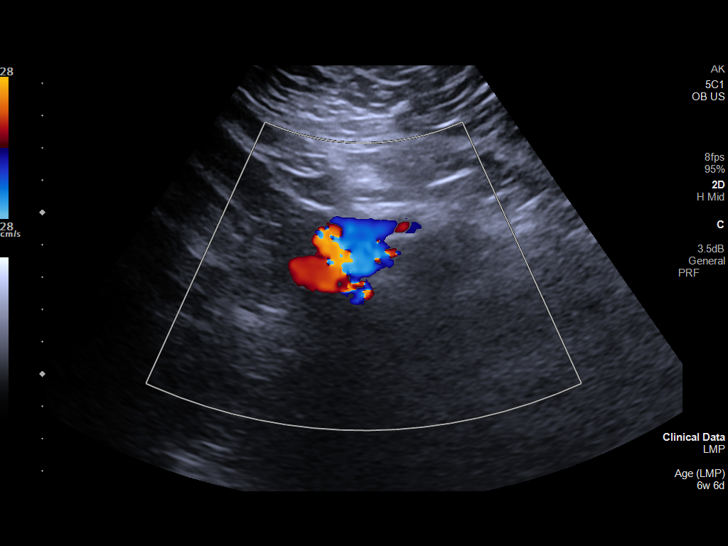
[im 40/97]
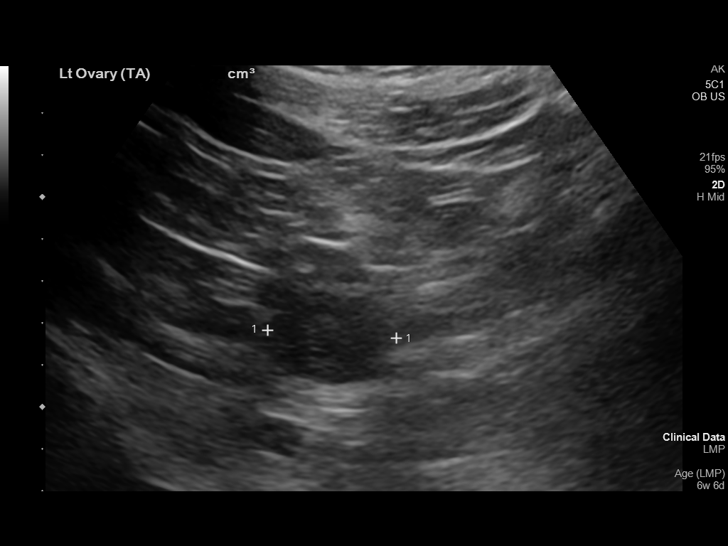
[im 50/97]
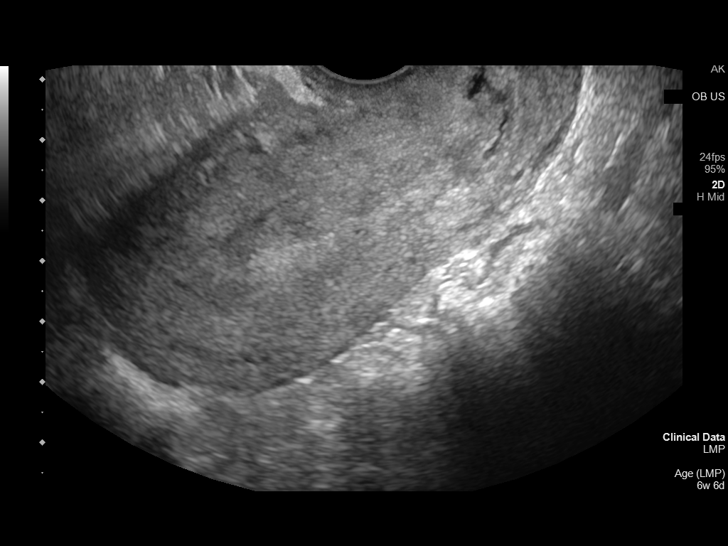
[im 57/97]
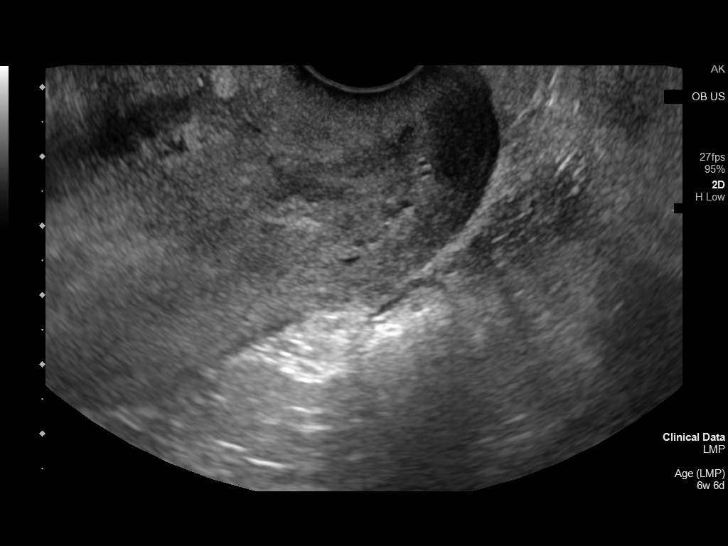
[im 65/97]
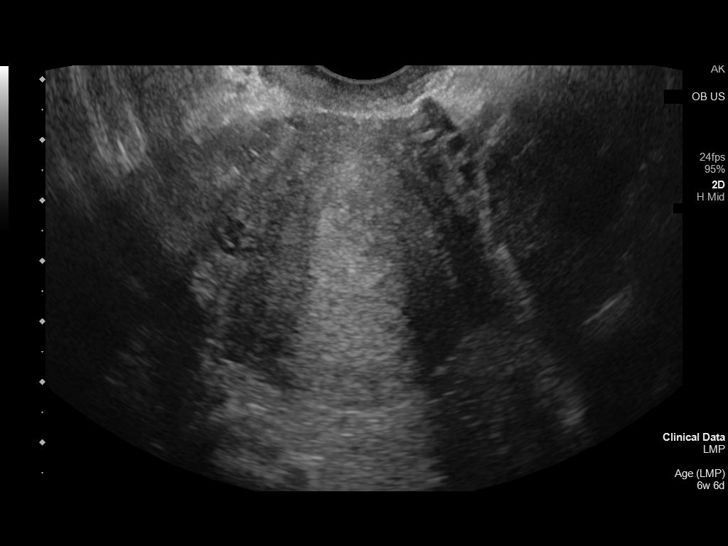
[im 72/97]
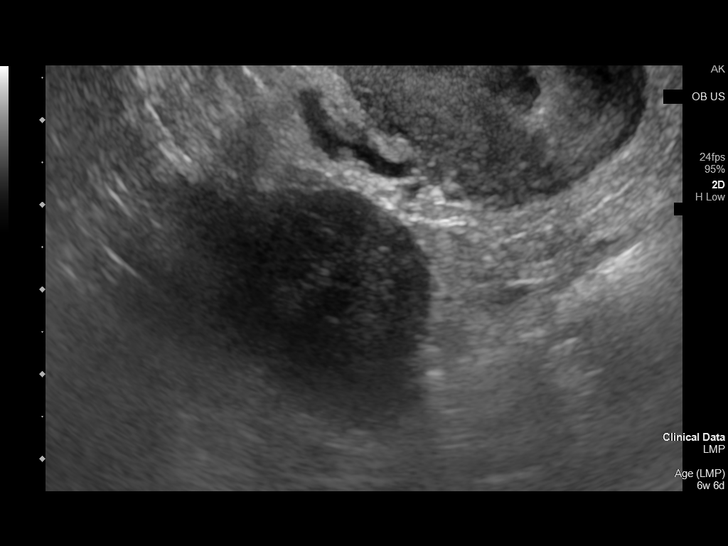
[im 79/97]
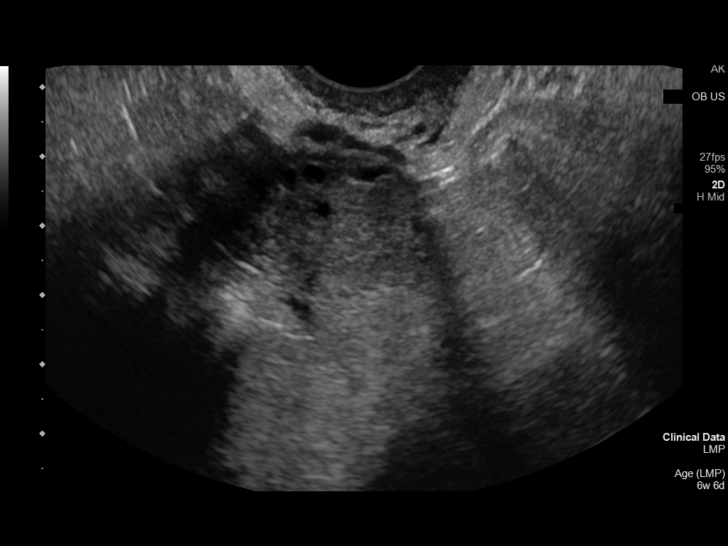
[im 86/97]
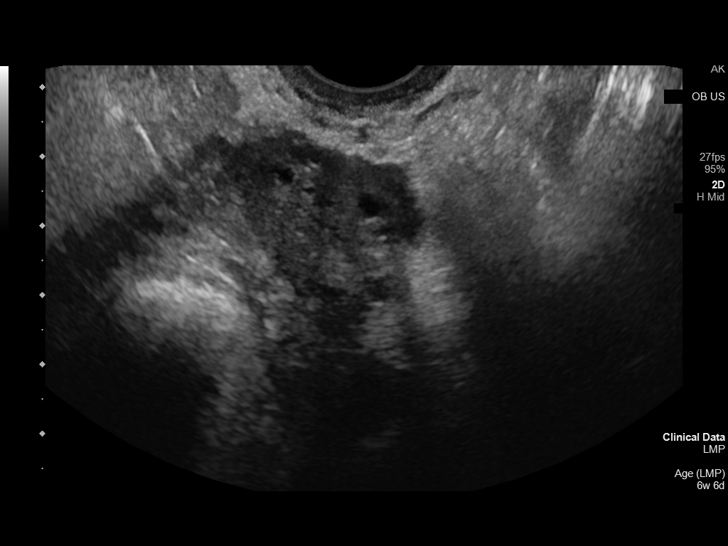
[im 93/97]
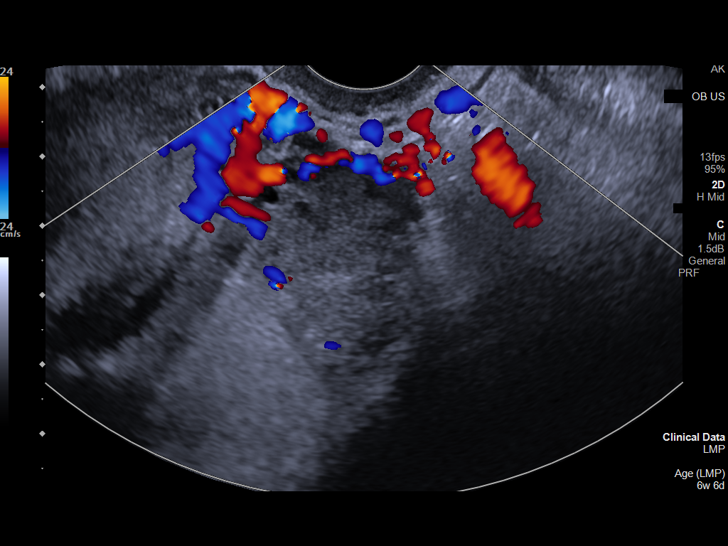

[13 of 28 positions shown; findings below may reference images not displayed]

FINDINGS: Intrauterine gestational sac: None

Subchorionic hemorrhage:  None visualized.

Maternal uterus/adnexae: Right ovary measures 3.0 x 2.2 x 3.1 cm.
Left ovary measures 3.2 x 2.5 x 3.1 cm. No abnormal ovarian or
adnexal masses. No abnormal free fluid in the pelvis. Anteverted
uterus with no uterine fibroids or other myometrial abnormalities.
Bilayer endometrial thickness 11 mm with mild endometrial
heterogeneity. No focal endometrial mass.
IMPRESSION: Non-localization of the pregnancy on this scan. No intrauterine
gestational sac. Mildly thickened (11 mm) and heterogeneous
endometrium with no focal endometrial mass. No adnexal masses.
Sonographic differential diagnosis includes spontaneous abortion or
less likely occult ectopic gestation. Recommend close clinical
follow-up and serial serum beta HCG monitoring, with repeat
obstetric scan as warranted by beta HCG levels and clinical
assessment.

## 2022-05-09 ENCOUNTER — Emergency Department
Admission: EM | Admit: 2022-05-09 | Discharge: 2022-05-09 | Disposition: A | Discharge status: Home / Self Care | Payer: Medicaid Other | Attending: Emergency Medicine | Admitting: Emergency Medicine

## 2022-05-09 ENCOUNTER — Encounter: Payer: Self-pay | Admitting: Emergency Medicine

## 2022-05-09 DIAGNOSIS — E119 Type 2 diabetes mellitus without complications: Secondary | ICD-10-CM | POA: Insufficient documentation

## 2022-05-09 DIAGNOSIS — Z7984 Long term (current) use of oral hypoglycemic drugs: Secondary | ICD-10-CM | POA: Diagnosis not present

## 2022-05-09 DIAGNOSIS — R300 Dysuria: Secondary | ICD-10-CM | POA: Diagnosis present

## 2022-05-09 DIAGNOSIS — N390 Urinary tract infection, site not specified: Secondary | ICD-10-CM | POA: Diagnosis not present

## 2022-05-09 LAB — URINALYSIS, ROUTINE W REFLEX MICROSCOPIC
Bacteria, UA: NONE SEEN
Bilirubin Urine: NEGATIVE
Glucose, UA: >=500 mg/dL — AB
Ketones, ur: 20 mg/dL — AB
Nitrite: NEGATIVE
Protein, ur: 100 mg/dL — AB
Specific Gravity, Urine: 1.041 — ABNORMAL HIGH (ref 1.005–1.030)
WBC, UA: >50 WBC/hpf (ref 0–5)
pH: 6 (ref 5.0–8.0)

## 2022-05-09 LAB — POC URINE PREG, ED: Preg Test, Ur: NEGATIVE

## 2022-05-09 MED ORDER — CEPHALEXIN 500 MG PO CAPS
500.0000 mg | ORAL_CAPSULE | Freq: Once | ORAL | Status: AC
  Administered 2022-05-09: 500 mg via ORAL
  Filled 2022-05-09: qty 1

## 2022-05-09 MED ORDER — IBUPROFEN 800 MG PO TABS
800.0000 mg | ORAL_TABLET | Freq: Once | ORAL | Status: AC
  Administered 2022-05-09: 800 mg via ORAL
  Filled 2022-05-09: qty 1

## 2022-05-09 MED ORDER — CEPHALEXIN 500 MG PO CAPS: 500.0000 mg | ORAL_CAPSULE | Freq: Two times a day (BID) | ORAL | 0 refills | Status: DC

## 2022-05-09 NOTE — Discharge Instructions (Signed)
You may alternate Tylenol 1000 mg every 6 hours as needed for pain, fever and Ibuprofen 800 mg every 6-8 hours as needed for pain, fever.  Please take Ibuprofen with food.  Do not take more than 4000 mg of Tylenol (acetaminophen) in a 24 hour period.  

## 2022-05-09 NOTE — ED Provider Notes (Signed)
[facility] Provider Note    Event Date/Time   First MD Initiated Contact with Patient 05/09/22 [phone]     (approximate)   History   Hematuria   HPI  Kristin Evans is a 32 y.o. female with history of diabetes, obesity who presents to the emergency department with complaints of dysuria, hematuria, urinary frequency and urgency, suprapubic discomfort.  States symptoms feel similar to when she has had previous UTI.  No flank pain, fevers, vomiting.  Denies vaginal bleeding or discharge.   History provided by patient.    Past Medical History:  Diagnosis Date   Diabetes mellitus without complication ([place])    Obesity     History reviewed. No pertinent surgical history.  MEDICATIONS:  Prior to Admission medications   Medication Sig Start Date End Date Taking? Authorizing Provider  acetaminophen (TYLENOL) 325 MG tablet Take 2 tablets (650 mg total) by mouth every 4 (four) hours as needed (for pain scale < 4  OR  temperature  >/=  100.5 F). 02/19/20   Minda Meo, CNM  albuterol (VENTOLIN HFA) 108 (90 Base) MCG/ACT inhaler Inhale 1-2 puffs into the lungs every 6 (six) hours as needed (cough). 11/04/20   Boddu, Erasmo Downer, FNP  amoxicillin-clavulanate (AUGMENTIN) 875-125 MG tablet Take 1 tablet by mouth every 12 (twelve) hours. 10/15/21   Hans Eden, NP  clotrimazole (GYNE-LOTRIMIN) 1 % vaginal cream Place 1 Applicatorful vaginally at bedtime. 05/05/21   Chase Picket, MD  fluconazole (DIFLUCAN) 150 MG tablet Take 1 tablet every 3 days for 3 doses 01/01/21   Nance Pear, MD  metFORMIN (GLUCOPHAGE) 1000 MG tablet Take 1 tablet (1,000 mg total) by mouth 2 (two) times daily with a meal. 01/01/21 01/01/22  Nance Pear, MD  metFORMIN (GLUCOPHAGE-XR) 500 MG 24 hr tablet Take 1 tablet (500 mg total) by mouth daily with breakfast. 05/10/20 05/10/21  Linda Hedges, CNM  Prenatal Vit-Fe Fumarate-FA (MULTIVITAMIN-PRENATAL) 27-0.8 MG TABS tablet Take  1 tablet by mouth daily at 12 noon.    [provider]    Physical Exam   Triage Vital Signs: ED Triage Vitals  Enc Vitals Group     BP 05/09/22 0321 125/80     Pulse Rate 05/09/22 0321 99     Resp 05/09/22 0321 16     Temp 05/09/22 0321 99.5 F (37.5 C)     Temp Source 05/09/22 0321 Oral     SpO2 05/09/22 0321 97 %     Weight 05/09/22 0315 251 lb 12.3 oz (114.2 kg)     Height 05/09/22 0315 5\' 3"  (1.6 m)     Head Circumference --      Peak Flow --      Pain Score 05/09/22 0315 6     Pain Loc --      Pain Edu? --      Excl. in [place]? --     Most recent vital signs: Vitals:   05/09/22 0321  BP: 125/80  Pulse: 99  Resp: 16  Temp: 99.5 F (37.5 C)  SpO2: 97%    CONSTITUTIONAL: Alert, responds appropriately to questions. Well-appearing; well-nourished HEAD: Normocephalic, atraumatic EYES: Conjunctivae clear, pupils appear equal, sclera nonicteric ENT: normal nose; moist mucous membranes NECK: Supple, normal ROM CARD: RRR; S1 and S2 appreciated RESP: Normal chest excursion without splinting or tachypnea; breath sounds clear and equal bilaterally; no wheezes, no rhonchi, no rales, no hypoxia or respiratory distress, speaking full sentences ABD/GI: Non-distended; soft, non-tender,  no rebound, no guarding, no peritoneal signs BACK: The back appears normal EXT: Normal ROM in all joints; no deformity noted, no edema SKIN: Normal color for age and race; warm; no rash on exposed skin NEURO: Moves all extremities equally, normal speech PSYCH: The patient's mood and manner are appropriate.   ED Results / Procedures / Treatments   LABS: (all labs ordered are listed, but only abnormal results are displayed) Labs Reviewed  URINALYSIS, ROUTINE W REFLEX MICROSCOPIC - Abnormal; Notable for the following components:      Result Value   Color, Urine YELLOW (*)    APPearance CLOUDY (*)    Specific Gravity, Urine 1.041 (*)    Glucose, UA >=500 (*)    Hgb urine dipstick  LARGE (*)    Ketones, ur 20 (*)    Protein, ur 100 (*)    Leukocytes,Ua LARGE (*)    All other components within normal limits  URINE CULTURE  POC URINE PREG, ED     EKG:   RADIOLOGY: My personal review and interpretation of imaging:    I have personally reviewed all radiology reports.   No results found.   PROCEDURES:  Critical Care performed: No     Procedures    IMPRESSION / MDM / ASSESSMENT AND PLAN / ED COURSE  I reviewed the triage vital signs and the nursing notes.    Patient here with urinary symptoms that feel like previous episodes of UTI.     DIFFERENTIAL DIAGNOSIS (includes but not limited to):   UTI, kidney stone, STI, pregnancy   Patient's presentation is most consistent with acute complicated illness / injury requiring diagnostic workup.   PLAN: Will obtain urinalysis, urine culture, urine pregnancy test.  Will give ibuprofen.  Abdominal exam benign.  Otherwise well-appearing, nontoxic.   MEDICATIONS GIVEN IN ED: Medications  cephALEXin (KEFLEX) capsule 500 mg (has no administration in time range)  ibuprofen (ADVIL) tablet 800 mg (800 mg Oral Given 05/09/22 0338)     ED COURSE: Patient's urine does appear infected.  Pregnancy test negative.  Culture pending.  Previous urine cultures have grown multiple species.  Will start her on Keflex.  First dose given here in the ED.  Recommended Tylenol, Motrin over-the-counter.  She has a PCP for follow-up.   At this time, I do not feel there is any life-threatening condition present. I reviewed all nursing notes, vitals, pertinent previous records.  All lab and urine results, EKGs, imaging ordered have been independently reviewed and interpreted by myself.  I reviewed all available radiology reports from any imaging ordered this visit.  Based on my assessment, I feel the patient is safe to be discharged home without further emergent workup and can continue workup as an outpatient as needed. Discussed  all findings, treatment plan as well as usual and customary return precautions.  They verbalize understanding and are comfortable with this plan.  Outpatient follow-up has been provided as needed.  All questions have been answered.    CONSULTS:  none   OUTSIDE RECORDS REVIEWED: Reviewed last OB/GYN note in April 2023.       FINAL CLINICAL IMPRESSION(S) / ED DIAGNOSES   Final diagnoses:  Acute UTI     Rx / DC Orders   ED Discharge Orders          Ordered    cephALEXin (KEFLEX) 500 MG capsule  2 times daily        05/09/22 0342  Note:  This document was prepared using Dragon voice recognition software and may include unintentional dictation errors.   Lliam Hoh, Delice Bison, DO 05/09/22 [phone]

## 2022-05-09 NOTE — ED Triage Notes (Signed)
Pt presents ambulatory to triage via POV with complaints of hematuria for the last 6 days with associated increased frequency and odor. No meds take PTA. A&Ox4 at this time. Denies fevers, chills, N/V/D, CP or SOB.

## 2022-05-11 LAB — URINE CULTURE: Culture: 20000 — AB

## 2022-06-29 ENCOUNTER — Ambulatory Visit
Admission: EM | Admit: 2022-06-29 | Discharge: 2022-06-29 | Disposition: A | Discharge status: Home / Self Care | Payer: Medicaid Other | Attending: Urgent Care | Admitting: Urgent Care

## 2022-06-29 DIAGNOSIS — B9689 Other specified bacterial agents as the cause of diseases classified elsewhere: Secondary | ICD-10-CM | POA: Diagnosis not present

## 2022-06-29 DIAGNOSIS — L089 Local infection of the skin and subcutaneous tissue, unspecified: Secondary | ICD-10-CM

## 2022-06-29 MED ORDER — SULFAMETHOXAZOLE-TRIMETHOPRIM 800-160 MG PO TABS: 1.0000 | ORAL_TABLET | Freq: Two times a day (BID) | ORAL | 0 refills | Status: AC

## 2022-06-29 NOTE — Discharge Instructions (Addendum)
Recommending evaluation of your infection by a wound care clinic or general surgeon.  Possible need for debridement.

## 2022-06-29 NOTE — ED Provider Notes (Signed)
Kristin Evans    CSN: [id number] Arrival date & time: 06/29/22  0843      History   Chief Complaint Chief Complaint  Patient presents with   Wound Infection    HPI Kristin Evans is a 32 y.o. female.   HPI  Presents to urgent care with concern for umbilicus infection.  She had her gallbladder removed a few years ago and endorses drainage and smell intermittently which she self treats.  She has not discussed this with her PCP  PMH includes DM 2, not at goal per last lab.  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 No pertinent surgical history.  OB History     Gravida  2   Para  1   Term  1   Preterm      AB      Living  1      SAB      IAB      Ectopic      Multiple  0   Live Births  1            Home Medications    Prior to Admission medications   Medication Sig Start Date End Date Taking? Authorizing Provider  acetaminophen (TYLENOL) 325 MG tablet Take 2 tablets (650 mg total) by mouth every 4 (four) hours as needed (for pain scale < 4  OR  temperature  >/=  100.5 F). 02/19/20   Gustavo Lah, CNM  albuterol (VENTOLIN HFA) 108 (90 Base) MCG/ACT inhaler Inhale 1-2 puffs into the lungs every 6 (six) hours as needed (cough). 11/04/20   Boddu, Belenda Cruise, FNP  amoxicillin-clavulanate (AUGMENTIN) 875-125 MG tablet Take 1 tablet by mouth every 12 (twelve) hours. 10/15/21   White, Elita Boone, NP  cephALEXin (KEFLEX) 500 MG capsule Take 1 capsule (500 mg total) by mouth 2 (two) times daily. 05/09/22   Ward, Layla Maw, DO  clotrimazole (GYNE-LOTRIMIN) 1 %  vaginal cream Place 1 Applicatorful vaginally at bedtime. 05/05/21   Merrilee Jansky, MD  fluconazole (DIFLUCAN) 150 MG tablet Take 1 tablet every 3 days for 3 doses 01/01/21   Phineas Semen, MD  metFORMIN (GLUCOPHAGE) 1000 MG tablet Take 1 tablet (1,000 mg total) by mouth 2 (two) times daily with a meal. 01/01/21 01/01/22  Phineas Semen, MD  metFORMIN (GLUCOPHAGE-XR) 500 MG 24 hr tablet Take 1 tablet (500 mg total) by mouth daily with breakfast. 05/10/20 05/10/21  Haroldine Laws, CNM  Prenatal Vit-Fe Fumarate-FA (MULTIVITAMIN-PRENATAL) 27-0.8 MG TABS tablet Take 1 tablet by mouth daily at 12 noon.    [provider]    Family History Family History  Family history unknown: Yes    Social History Social History   Tobacco Use   Smoking status: Former   Smokeless tobacco: Never  Substance Use Topics   Alcohol use: No   Drug use: No     Allergies   Pravastatin   Review of Systems Review of Systems   Physical Exam Triage Vital Signs ED Triage Vitals [06/29/22 0857]  Enc Vitals Group     BP 129/87  Pulse Rate (!) 109     Resp 16     Temp 97.9 F (36.6 C)     Temp Source Oral     SpO2 96 %     Weight      Height      Head Circumference      Peak Flow      Pain Score      Pain Loc      Pain Edu?      Excl. in [place]?    No data found.  Updated Vital Signs BP 129/87 (BP Location: Left Arm)   Pulse (!) 109   Temp 97.9 F (36.6 C) (Oral)   Resp 16   SpO2 96%   Visual Acuity Right Eye Distance:   Left Eye Distance:   Bilateral Distance:    Right Eye Near:   Left Eye Near:    Bilateral Near:     Physical Exam Vitals reviewed.  Constitutional:      Appearance: Normal appearance. She is obese.  Abdominal:     Palpations: Abdomen is soft.     Tenderness: There is abdominal tenderness in the periumbilical area.    Skin:    General: Skin is warm and dry.  Neurological:     General: No focal deficit present.     Mental Status: She is alert  and oriented to person, place, and time.  Psychiatric:        Mood and Affect: Mood normal.        Behavior: Behavior normal.      UC Treatments / Results  Labs (all labs ordered are listed, but only abnormal results are displayed) Labs Reviewed - No data to display  EKG   Radiology No results found.  Procedures Procedures (including critical care time)  Medications Ordered in UC Medications - No data to display  Initial Impression / Assessment and Plan / UC Course  I have reviewed the triage vital signs and the nursing notes.  Pertinent labs & imaging results that were available during my care of the patient were reviewed by me and considered in my medical decision making (see chart for details).   Kristin Evans is a 32 y.o. female presenting with umbilicus tenderness and erythema. Patient is afebrile without recent antipyretics, satting well on room air. Overall is well appearing though non-toxic, well hydrated, without respiratory distress.  Abdomen is soft with periumbilical tenderness.  There is erythema at her umbilicus and deep within.  Tenderness with palpation.  No discharge is present.  Suspect umbilical abscess related to old laparoscopic wound.  Will not attempt I&D today though she endorses discharge as recently as yesterday.  Will prescribe antibiotic therapy using Bactrim given the report of green-colored discharge and ask her to follow-up with her PCP for possible need of imaging versus evaluation by a general surgeon.  Counseled patient on potential for adverse effects with medications prescribed/recommended today, ER and return-to-clinic precautions discussed, patient verbalized understanding and agreement with care plan.   Final Clinical Impressions(s) / UC Diagnoses   Final diagnoses:  None   Discharge Instructions   None    ED Prescriptions   None    PDMP not reviewed this encounter.   Charma Igo, [place] 06/29/22 [phone]

## 2022-06-29 NOTE — ED Triage Notes (Signed)
Patient presents to UC for belly button infection. States on-going issue since she had her gall bladder removed. Has noted drainage and smell from belly button region.

## 2022-08-07 ENCOUNTER — Ambulatory Visit
Admission: EM | Admit: 2022-08-07 | Discharge: 2022-08-07 | Disposition: A | Discharge status: Home / Self Care | Payer: Medicaid Other | Attending: Emergency Medicine | Admitting: Emergency Medicine

## 2022-08-07 DIAGNOSIS — M5441 Lumbago with sciatica, right side: Secondary | ICD-10-CM

## 2022-08-07 MED ORDER — CYCLOBENZAPRINE HCL 10 MG PO TABS: 10.0000 mg | ORAL_TABLET | Freq: Two times a day (BID) | ORAL | 0 refills | Status: DC | PRN

## 2022-08-07 NOTE — Discharge Instructions (Addendum)
Take ibuprofen as needed for discomfort.  Take the muscle relaxer as needed for muscle spasm; Do not drive, operate machinery, or drink alcohol with this medication as it can cause drowsiness.   Follow up with your primary care provider if your symptoms are not improving.     

## 2022-08-07 NOTE — ED Triage Notes (Addendum)
Patient to Urgent Care with complaints of lower back pain following an injury. Pain radiates down right leg to her knee.  Patient reports she was moving a heavy dresser yesterday and felt her back tense up. Hx of sciatica. Taking tylenol and motrin.  Attempted to work today. States nausea and vomiting d/t to pain.

## 2022-08-07 NOTE — ED Provider Notes (Signed)
Renaldo Fiddler    CSN: [id number] Arrival date & time: 08/07/22  1341      History   Chief Complaint Chief Complaint  Patient presents with   Back Pain    HPI Kristin Evans is a 32 y.o. female.  Patient presents with right lower back pain which radiates down her right leg x 1 day.  The pain started when she was moving a heavy dresser yesterday.  She felt her back tensed up.  She states this is similar to previous episodes of sciatica.  Treatment attempted with Tylenol and ibuprofen.  Patient left work today due to the pain.  She denies numbness, weakness, saddle anesthesia, loss of bowel/bladder control, fever, abdominal pain, dysuria, hematuria, or other symptoms.  Her medical history includes diabetes and obesity.  The history is provided by the patient and medical records.    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 No pertinent surgical history.  OB History     Gravida  2   Para  1   Term  1   Preterm      AB      Living  1      SAB      IAB      Ectopic      Multiple  0   Live Births  1            Home Medications    Prior to Admission medications   Medication Sig Start Date End Date Taking? Authorizing Provider  cyclobenzaprine (FLEXERIL) 10 MG tablet Take 1 tablet (10 mg total) by mouth 2 (two) times daily as needed for muscle spasms. 08/07/22  Yes Mickie Bail, NP  acetaminophen (TYLENOL) 325 MG tablet Take 2 tablets (650 mg total) by mouth every 4 (four) hours as needed (for pain scale < 4  OR  temperature  >/=  100.5 F). 02/19/20   Gustavo Lah, CNM  albuterol (VENTOLIN HFA) 108 (90 Base) MCG/ACT inhaler Inhale 1-2 puffs into the lungs every 6 (six) hours as needed (cough). 11/04/20   Boddu, Belenda Cruise, FNP  cephALEXin (KEFLEX) 500 MG capsule Take 1 capsule (500 mg total) by mouth 2 (two) times daily. Patient not taking: Reported on 08/07/2022 05/09/22   Ward, Layla Maw, DO  clotrimazole (GYNE-LOTRIMIN) 1 % vaginal cream Place 1 Applicatorful vaginally at bedtime. Patient not taking: Reported on 08/07/2022 05/05/21   Merrilee Jansky, MD  fluconazole (DIFLUCAN) 150 MG tablet Take 1 tablet every 3 days for 3 doses Patient not taking: Reported on 08/07/2022 01/01/21   Phineas Semen, MD  metFORMIN (GLUCOPHAGE) 1000 MG tablet Take 1 tablet (1,000 mg total) by mouth 2 (two) times daily with a meal. 01/01/21 01/01/22  Phineas Semen, MD  metFORMIN (GLUCOPHAGE-XR) 500 MG 24 hr tablet Take 1 tablet (500 mg total) by mouth daily with breakfast. 05/10/20 05/10/21  Haroldine Laws, CNM  Prenatal Vit-Fe Fumarate-FA (MULTIVITAMIN-PRENATAL) 27-0.8 MG TABS tablet Take 1 tablet by mouth daily at 12 noon.    [provider]    Family History Family History  Family history unknown:  Yes    Social History Social History   Tobacco Use   Smoking status: Former   Smokeless tobacco: Never  Building services engineer Use: Every day  Substance Use Topics   Alcohol use: No   Drug use: No     Allergies   Pravastatin   Review of Systems Review of Systems  Constitutional:  Negative for chills and fever.  Gastrointestinal:  Negative for abdominal pain, diarrhea and vomiting.  Genitourinary:  Negative for dysuria, flank pain, frequency and hematuria.  Musculoskeletal:  Positive for back pain. Negative for gait problem and joint swelling.  Skin:  Negative for color change, rash and wound.  Neurological:  Negative for weakness and numbness.     Physical Exam Triage Vital Signs ED Triage Vitals  Enc Vitals Group     BP 08/07/22 1350 123/84      Pulse Rate 08/07/22 1350 93     Resp 08/07/22 1350 17     Temp 08/07/22 1350 98.3 F (36.8 C)     Temp src --      SpO2 08/07/22 1350 97 %     Weight --      Height --      Head Circumference --      Peak Flow --      Pain Score 08/07/22 1344 8     Pain Loc --      Pain Edu? --      Excl. in GC? --    No data found.  Updated Vital Signs BP 123/84   Pulse 93   Temp 98.3 F (36.8 C)   Resp 17   SpO2 97%   Visual Acuity Right Eye Distance:   Left Eye Distance:   Bilateral Distance:    Right Eye Near:   Left Eye Near:    Bilateral Near:     Physical Exam Vitals and nursing note reviewed.  Constitutional:      General: She is not in acute distress.    Appearance: She is well-developed.  HENT:     Mouth/Throat:     Mouth: Mucous membranes are moist.  Cardiovascular:     Rate and Rhythm: Normal rate and regular rhythm.     Heart sounds: Normal heart sounds.  Pulmonary:     Effort: Pulmonary effort is normal. No respiratory distress.     Breath sounds: Normal breath sounds.  Abdominal:     General: Bowel sounds are normal.     Palpations: Abdomen is soft.     Tenderness: There is no abdominal tenderness. There is no right CVA tenderness, left CVA tenderness, guarding or rebound.  Musculoskeletal:        General: No swelling, tenderness, deformity or signs of injury. Normal range of motion.     Cervical back: Neck supple.  Skin:    General: Skin is warm and dry.     Capillary Refill: Capillary refill takes less than 2 seconds.     Findings: No bruising, erythema, lesion or rash.  Neurological:     General: No focal deficit present.     Mental Status: She is alert and oriented to person, place, and time.     Sensory: No sensory deficit.     Motor: No weakness.     Gait: Gait normal.  Psychiatric:        Mood and Affect: Mood normal.        Behavior: Behavior normal.      UC Treatments / Results  Labs (all labs ordered are listed, but only abnormal  results are displayed) Labs Reviewed - No data to display  EKG   Radiology No results found.  Procedures Procedures (including critical care time)  Medications Ordered in UC Medications - No data to display  Initial Impression / Assessment and Plan / UC Course  I have reviewed the triage vital signs and the nursing notes.  Pertinent labs & imaging results that were available during my care of the patient were reviewed by me and considered in my medical decision making (see chart for details).    Right side low back pain with right-sided sciatica.  Treating with ibuprofen and Flexeril.  Precautions for drowsiness with Flexeril discussed.  Education provided on sciatica.  Instructed patient to follow up with her PCP if her symptoms are not improving.  She agrees to plan of care.    Final Clinical Impressions(s) / UC Diagnoses   Final diagnoses:  Acute right-sided low back pain with right-sided sciatica     Discharge Instructions      Take ibuprofen as needed for discomfort.  Take the muscle relaxer as needed for muscle spasm; Do not drive, operate machinery, or drink alcohol with this medication as it can cause drowsiness.   Follow up with your primary care provider if your symptoms are not improving.         ED Prescriptions     Medication Sig Dispense Auth. Provider   cyclobenzaprine (FLEXERIL) 10 MG tablet Take 1 tablet (10 mg total) by mouth 2 (two) times daily as needed for muscle spasms. 20 tablet Mickie Bail, NP      I have reviewed the PDMP during this encounter.   Mickie Bail, NP 08/07/22 1422

## 2022-09-10 ENCOUNTER — Encounter: Payer: Self-pay | Admitting: *Deleted

## 2022-09-10 ENCOUNTER — Emergency Department
Admission: EM | Admit: 2022-09-10 | Discharge: 2022-09-12 | Disposition: A | Discharge status: Other Acute Inpatient Hospital | Payer: Medicaid Other | Attending: Emergency Medicine | Admitting: Emergency Medicine

## 2022-09-10 ENCOUNTER — Other Ambulatory Visit: Payer: Self-pay

## 2022-09-10 DIAGNOSIS — F4310 Post-traumatic stress disorder, unspecified: Secondary | ICD-10-CM | POA: Diagnosis not present

## 2022-09-10 DIAGNOSIS — F32A Depression, unspecified: Secondary | ICD-10-CM | POA: Insufficient documentation

## 2022-09-10 DIAGNOSIS — R45851 Suicidal ideations: Secondary | ICD-10-CM | POA: Diagnosis not present

## 2022-09-10 DIAGNOSIS — R440 Auditory hallucinations: Secondary | ICD-10-CM | POA: Diagnosis not present

## 2022-09-10 DIAGNOSIS — Z1152 Encounter for screening for COVID-19: Secondary | ICD-10-CM | POA: Diagnosis not present

## 2022-09-10 LAB — URINE DRUG SCREEN, QUALITATIVE (ARMC ONLY)
Amphetamines, Ur Screen: NOT DETECTED
Barbiturates, Ur Screen: NOT DETECTED
Benzodiazepine, Ur Scrn: NOT DETECTED
Cannabinoid 50 Ng, Ur ~~LOC~~: POSITIVE — AB
Cocaine Metabolite,Ur ~~LOC~~: NOT DETECTED
MDMA (Ecstasy)Ur Screen: NOT DETECTED
Methadone Scn, Ur: NOT DETECTED
Opiate, Ur Screen: NOT DETECTED
Phencyclidine (PCP) Ur S: NOT DETECTED
Tricyclic, Ur Screen: NOT DETECTED

## 2022-09-10 LAB — COMPREHENSIVE METABOLIC PANEL
ALT: 24 U/L (ref 0–44)
AST: 16 U/L (ref 15–41)
Albumin: 4.3 g/dL (ref 3.5–5.0)
Alkaline Phosphatase: 60 U/L (ref 38–126)
Anion gap: 7 (ref 5–15)
BUN: 11 mg/dL (ref 6–20)
CO2: 25 mmol/L (ref 22–32)
Calcium: 9.4 mg/dL (ref 8.9–10.3)
Chloride: 100 mmol/L (ref 98–111)
Creatinine, Ser: 0.61 mg/dL (ref 0.44–1.00)
GFR, Estimated: >60 mL/min (ref >60)
Glucose, Bld: 290 mg/dL — ABNORMAL HIGH (ref 70–99)
Potassium: 4 mmol/L (ref 3.5–5.1)
Sodium: 132 mmol/L — ABNORMAL LOW (ref 135–145)
Total Bilirubin: 0.8 mg/dL (ref 0.3–1.2)
Total Protein: 8.4 g/dL — ABNORMAL HIGH (ref 6.5–8.1)

## 2022-09-10 LAB — CBC
HCT: 46.2 % — ABNORMAL HIGH (ref 36.0–46.0)
Hemoglobin: 15.6 g/dL — ABNORMAL HIGH (ref 12.0–15.0)
MCH: 29.1 pg (ref 26.0–34.0)
MCHC: 33.8 g/dL (ref 30.0–36.0)
MCV: 86.2 fL (ref 80.0–100.0)
Platelets: 363 10*3/uL (ref 150–400)
RBC: 5.36 MIL/uL — ABNORMAL HIGH (ref 3.87–5.11)
RDW: 11.7 % (ref 11.5–15.5)
WBC: 9.5 10*3/uL (ref 4.0–10.5)
nRBC: 0 % (ref 0.0–0.2)

## 2022-09-10 LAB — POC URINE PREG, ED: Preg Test, Ur: NEGATIVE

## 2022-09-10 LAB — ACETAMINOPHEN LEVEL: Acetaminophen (Tylenol), Serum: <10 ug/mL — ABNORMAL LOW (ref 10–30)

## 2022-09-10 LAB — ETHANOL: Alcohol, Ethyl (B): <10 mg/dL (ref <10)

## 2022-09-10 LAB — SALICYLATE LEVEL: Salicylate Lvl: <7 mg/dL — ABNORMAL LOW (ref 7.0–30.0)

## 2022-09-10 MED ORDER — ACETAMINOPHEN 325 MG PO TABS
650.0000 mg | ORAL_TABLET | Freq: Once | ORAL | Status: AC
  Administered 2022-09-10: 650 mg via ORAL
  Filled 2022-09-10: qty 2

## 2022-09-10 NOTE — ED Notes (Signed)
Pt given [place] sandwich tray with juice - eating at this time.

## 2022-09-10 NOTE — ED Notes (Signed)
Pt reports jaw and head pain from clinching her teeth for long periods today due to emotions, secure chat to Dr. Derrill Kay.  Pt states she has been dealing with worsening AH and now SI within last 30 days. States she believes that she has had AH for 15 years but she now experiences it worse and identifies it as this instead of her conscious. Pt states she has plan to cut throat with her scissors at work. Pt tearful, calm, cooperative during assessment. Educated on plan of care. Provided with snack at this time.

## 2022-09-10 NOTE — ED Triage Notes (Addendum)
Pt brought in by [employer].  Pt is IVC.  Pt reports feeling depressed.  Pt states SI   pt reports hearing voices.  pt denies HI   pt denies drug use or etoh use.  Pt calm and cooperative.  Pt alert   speech clear.
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
## 2022-09-10 NOTE — ED Notes (Addendum)
Blue pants Purse Pink shoes Wallace Cullens footies Floral blue shirt Hair tie Green underwear White bra Yellow colored necklace

## 2022-09-10 NOTE — ED Provider Notes (Signed)
[facility] Provider Note    Event Date/Time   First MD Initiated Contact with Patient 09/10/22 1646     (approximate)   History   Psychiatric Evaluation   HPI  Kristin Evans is a 32 y.o. female who presented to the emergency department today under IVC because of concerns for depression and suicidal ideation.  Patient states that she has been depressed for a long time.  Things have been worse recently.  She denies any medical complaints.     Physical Exam   Triage Vital Signs: ED Triage Vitals  Encounter Vitals Group     BP 09/10/22 1614 (!) 140/93     Systolic BP Percentile --      Diastolic BP Percentile --      Pulse Rate 09/10/22 1614 74     Resp 09/10/22 1614 18     Temp 09/10/22 1614 98.6 F (37 C)     Temp Source 09/10/22 1614 Oral     SpO2 09/10/22 1614 98 %     Weight 09/10/22 1611 218 lb (98.9 kg)     Height 09/10/22 1611 5\' 3"  (1.6 m)     Head Circumference --      Peak Flow --      Pain Score 09/10/22 1611 6     Pain Loc --      Pain Education --      Exclude from Growth Chart --     Most recent vital signs: Vitals:   09/10/22 1614  BP: (!) 140/93  Pulse: 74  Resp: 18  Temp: 98.6 F (37 C)  SpO2: 98%   General: Awake, alert, oriented. CV:  Good peripheral perfusion. Regular rate and rhythm. Resp:  Normal effort. Lungs clear. Abd:  No distention.  Other:  Depressed.   ED Results / Procedures / Treatments   Labs (all labs ordered are listed, but only abnormal results are displayed) Labs Reviewed  COMPREHENSIVE METABOLIC PANEL - Abnormal; Notable for the following components:      Result Value   Sodium 132 (*)    Glucose, Bld 290 (*)    Total Protein 8.4 (*)    All other components within normal limits  SALICYLATE LEVEL - Abnormal; Notable for the following components:   Salicylate Lvl <7.0 (*)    All other components within normal limits  ACETAMINOPHEN LEVEL - Abnormal; Notable for the following  components:   Acetaminophen (Tylenol), Serum <10 (*)    All other components within normal limits  CBC - Abnormal; Notable for the following components:   RBC 5.36 (*)    Hemoglobin 15.6 (*)    HCT 46.2 (*)    All other components within normal limits  ETHANOL  URINE DRUG SCREEN, QUALITATIVE ([facility] ONLY)  POC URINE PREG, ED     EKG  None   RADIOLOGY None  PROCEDURES:  Critical Care performed: No  MEDICATIONS ORDERED IN ED: Medications - No data to display   IMPRESSION / MDM / ASSESSMENT AND PLAN / ED COURSE  I reviewed the triage vital signs and the nursing notes.                              Differential diagnosis includes, but is not limited to, depression, drug induced mood disorder.  Patient's presentation is most consistent with acute presentation with potential threat to life or bodily function.   Patient presented to the  emergency department today under IVC because of concerns for depression and SI.  Patient does endorse depression and SI.  Will continue IVC and have psychiatry evaluate.  The patient has been placed in psychiatric observation due to the need to provide a safe environment for the patient while obtaining psychiatric consultation and evaluation, as well as ongoing medical and medication management to treat the patient's condition.  The patient has been placed under full IVC at this time.      FINAL CLINICAL IMPRESSION(S) / ED DIAGNOSES   Depression    Note:  This document was prepared using Dragon voice recognition software and may include unintentional dictation errors.    Phineas Semen, MD 09/10/22 [phone]

## 2022-09-11 DIAGNOSIS — R45851 Suicidal ideations: Secondary | ICD-10-CM

## 2022-09-11 DIAGNOSIS — R440 Auditory hallucinations: Secondary | ICD-10-CM

## 2022-09-11 LAB — RESP PANEL BY RT-PCR (RSV, FLU A&B, COVID)  RVPGX2
Influenza A by PCR: NEGATIVE
Influenza B by PCR: NEGATIVE
Resp Syncytial Virus by PCR: NEGATIVE
SARS Coronavirus 2 by RT PCR: NEGATIVE

## 2022-09-11 LAB — CBG MONITORING, ED
Glucose-Capillary: 480 mg/dL — ABNORMAL HIGH (ref 70–99)
Glucose-Capillary: 231 mg/dL — ABNORMAL HIGH (ref 70–99)
Glucose-Capillary: 269 mg/dL — ABNORMAL HIGH (ref 70–99)
Glucose-Capillary: 292 mg/dL — ABNORMAL HIGH (ref 70–99)

## 2022-09-11 MED ORDER — INSULIN ASPART 100 UNIT/ML IJ SOLN
INTRAMUSCULAR | Status: AC
  Administered 2022-09-11: 5 [IU] via SUBCUTANEOUS
  Filled 2022-09-11: qty 1

## 2022-09-11 MED ORDER — ACETAMINOPHEN 500 MG PO TABS
1000.0000 mg | ORAL_TABLET | Freq: Once | ORAL | Status: AC
  Administered 2022-09-11: 1000 mg via ORAL
  Filled 2022-09-11: qty 2

## 2022-09-11 MED ORDER — HYDROXYZINE HCL 25 MG PO TABS
50.0000 mg | ORAL_TABLET | Freq: Three times a day (TID) | ORAL | Status: DC | PRN
  Administered 2022-09-11: 50 mg via ORAL
  Filled 2022-09-11: qty 2

## 2022-09-11 MED ORDER — INSULIN ASPART 100 UNIT/ML IJ SOLN
0.0000 [IU] | Freq: Every day | INTRAMUSCULAR | Status: DC
  Administered 2022-09-11: 3 [IU] via SUBCUTANEOUS
  Filled 2022-09-11: qty 1

## 2022-09-11 MED ORDER — LORAZEPAM 2 MG PO TABS
ORAL_TABLET | ORAL | Status: AC
  Filled 2022-09-11: qty 1

## 2022-09-11 MED ORDER — INSULIN ASPART 100 UNIT/ML IJ SOLN
10.0000 [IU] | Freq: Once | INTRAMUSCULAR | Status: AC
  Administered 2022-09-11: 10 [IU] via SUBCUTANEOUS

## 2022-09-11 MED ORDER — LORAZEPAM 2 MG PO TABS
2.0000 mg | ORAL_TABLET | Freq: Once | ORAL | Status: AC
  Administered 2022-09-11: 2 mg via ORAL

## 2022-09-11 MED ORDER — INSULIN ASPART 100 UNIT/ML IJ SOLN
0.0000 [IU] | Freq: Three times a day (TID) | INTRAMUSCULAR | Status: DC
  Administered 2022-09-12: 11 [IU] via SUBCUTANEOUS
  Administered 2022-09-12: 8 [IU] via SUBCUTANEOUS
  Filled 2022-09-11 (×4): qty 1

## 2022-09-11 MED ORDER — METFORMIN HCL 500 MG PO TABS
ORAL_TABLET | ORAL | Status: AC
  Administered 2022-09-12: 1000 mg via ORAL
  Filled 2022-09-11: qty 2

## 2022-09-11 MED ORDER — METFORMIN HCL 500 MG PO TABS
1000.0000 mg | ORAL_TABLET | Freq: Two times a day (BID) | ORAL | Status: DC
  Administered 2022-09-11: 1000 mg via ORAL
  Filled 2022-09-11: qty 2

## 2022-09-11 MED ORDER — TRAZODONE HCL 50 MG PO TABS: 50.0000 mg | ORAL_TABLET | Freq: Every evening | ORAL | Status: DC | PRN

## 2022-09-11 NOTE — ED Notes (Signed)
Pt tearful and upset, was having active VH of a man in the corner. Order placed by stafford

## 2022-09-11 NOTE — ED Notes (Signed)
INVOLUNTARY WITH RECOMMENDATION FOR INPT PSYCH ADMIT

## 2022-09-11 NOTE — ED Notes (Signed)
Pt reports to this nurse she is diabetic and feels her CBG is elevated due to having a headache. States she takes Metformin 1000mg  bid. Secure chat sent to Dr. Scotty Court to update. Obtaining CBG now

## 2022-09-11 NOTE — ED Notes (Signed)
Pt asleep, will offer snack when she is awake.

## 2022-09-11 NOTE — ED Notes (Signed)
Pt received dinner tray. No complaints at this time.

## 2022-09-11 NOTE — ED Notes (Addendum)
Pt asleep, will collect vitals when she wakes up.  Notified current nurse.

## 2022-09-11 NOTE — ED Notes (Signed)
Pt complained of VH again, requesting med to stop them, educated pt on situation. PRN anxiety provided
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
## 2022-09-11 NOTE — BH Assessment (Signed)
Patient has been accepted to [facility].  Patient assigned to [facility] Accepting physician is Dr. Sallyanne Kuster.  Call report to [phone].  Representative was [place].   ER Staff is aware of it:  Melody, ER Secretary  Dr. Larinda Buttery, ER MD  Rosalie Doctor, Patient's Nurse     Patient can transport anytime after 7:00am on 09/12/2022.

## 2022-09-11 NOTE — ED Notes (Signed)
Pt provided snack and beverage 

## 2022-09-11 NOTE — BH Assessment (Signed)
Per [facility], patient to be referred out of system.  Referral information for Psychiatric Hospitalization faxed to;   [facility] [phone]- [phone])  [facility] [phone]- [phone]),   [facility] [phone], [phone], [phone] or [phone]),   [place] [phone]--- [phone]--- [phone]--- [phone])  [address] [phone]),   [facility] [phone] -or- [phone]),   [facility] [phone])  [facility] [phone])

## 2022-09-11 NOTE — ED Provider Notes (Signed)
Emergency Medicine Observation Re-evaluation Note  Kristin Evans is a 32 y.o. female, seen on rounds today.  Pt initially presented to the ED for complaints of Psychiatric Evaluation   Physical Exam  BP 128/81 (BP Location: Left Arm)   Pulse 81   Temp 98.7 F (37.1 C) (Oral)   Resp 18   Ht 5\' 3"  (1.6 m)   Wt 98.9 kg   LMP  (LMP Unknown)   SpO2 96%   BMI 38.62 kg/m  Physical Exam General: NAd  ED Course / MDM  EKG:   I have reviewed the labs performed to date as well as medications administered while in observation.  Recent changes in the last 24 hours include .  Plan  Current plan is for psych/soc.    Willy Eddy, MD 09/11/22 [phone]

## 2022-09-11 NOTE — Inpatient Diabetes Management (Signed)
Inpatient Diabetes Program Recommendations  AACE/ADA: New Consensus Statement on Inpatient Glycemic Control (2015)  Target Ranges:  Prepandial:   less than 140 mg/dL      Peak postprandial:   less than 180 mg/dL (1-2 hours)      Critically ill patients:  140 - 180 mg/dL   Lab Results  Component Value Date   GLUCAP 480 (H) 09/11/2022   HGBA1C 6.6 (H) 05/07/2020    Review of Glycemic Control  Latest Reference Range & Units 09/11/22 13:40  Glucose-Capillary 70 - 99 mg/dL 086 (H)  (H): Data is abnormally high  Diabetes history: DM2 Outpatient Diabetes medications: Metformin 1000 mg BID Current orders for Inpatient glycemic control: Novolog 10 units x 1, Metformin 1000 mg BID  Inpatient Diabetes Program Recommendations:    Please consider:  Novolog 0-15 units TID and 0-5 units at bedtime  Will continue to follow while inpatient.  Thank you, Dulce Sellar, MSN, CDCES Diabetes Coordinator Inpatient Diabetes Program [phone] (team pager from 8a-5p)

## 2022-09-12 LAB — CBG MONITORING, ED
Glucose-Capillary: 312 mg/dL — ABNORMAL HIGH (ref 70–99)
Glucose-Capillary: 280 mg/dL — ABNORMAL HIGH (ref 70–99)

## 2022-09-12 NOTE — ED Notes (Signed)
Pt is sleeping

## 2022-09-12 NOTE — ED Provider Notes (Signed)
Emergency Medicine Observation Re-evaluation Note  Physical Exam   BP 103/63 (BP Location: Right Arm)   Pulse 83   Temp 98.2 F (36.8 C) (Oral)   Resp 17   Ht 5\' 3"  (1.6 m)   Wt 98.9 kg   LMP  (LMP Unknown)   SpO2 96%   BMI 38.62 kg/m   Patient appears in no acute distress.  ED Course / MDM   No reported events during my shift at the time of this note.   Pt transfer to [facility] [place] today  Pilar Jarvis MD    Pilar Jarvis, MD 09/12/22 [phone]

## 2022-09-12 NOTE — ED Notes (Signed)
IVC PT  HAS BEEN ACCEPTED TO [facility], PT CAN BE TRANSPORTED AT ANYTIME AFTER 7:00AM ON 09/12/2022.

## 2022-09-12 NOTE — ED Notes (Signed)
EMTALA Reviewed by this RN at this time.

## 2022-09-14 LAB — HEMOGLOBIN A1C
Hgb A1c MFr Bld: 12 % — ABNORMAL HIGH (ref 4.8–5.6)
Mean Plasma Glucose: 298 mg/dL

## 2022-10-21 ENCOUNTER — Emergency Department
Admission: EM | Admit: 2022-10-21 | Discharge: 2022-10-21 | Disposition: A | Discharge status: Home / Self Care | Payer: Medicaid Other | Attending: Emergency Medicine | Admitting: Emergency Medicine

## 2022-10-21 ENCOUNTER — Other Ambulatory Visit: Payer: Self-pay

## 2022-10-21 ENCOUNTER — Emergency Department: Payer: Medicaid Other

## 2022-10-21 DIAGNOSIS — R102 Pelvic and perineal pain: Secondary | ICD-10-CM

## 2022-10-21 DIAGNOSIS — B3731 Acute candidiasis of vulva and vagina: Secondary | ICD-10-CM | POA: Diagnosis not present

## 2022-10-21 DIAGNOSIS — R1031 Right lower quadrant pain: Secondary | ICD-10-CM

## 2022-10-21 LAB — COMPREHENSIVE METABOLIC PANEL
ALT: 21 U/L (ref 0–44)
AST: 14 U/L — ABNORMAL LOW (ref 15–41)
Albumin: 4 g/dL (ref 3.5–5.0)
Alkaline Phosphatase: 48 U/L (ref 38–126)
Anion gap: 5 (ref 5–15)
BUN: 10 mg/dL (ref 6–20)
CO2: 21 mmol/L — ABNORMAL LOW (ref 22–32)
Calcium: 8.9 mg/dL (ref 8.9–10.3)
Chloride: 104 mmol/L (ref 98–111)
Creatinine, Ser: 0.52 mg/dL (ref 0.44–1.00)
GFR, Estimated: >60 mL/min (ref >60)
Glucose, Bld: 280 mg/dL — ABNORMAL HIGH (ref 70–99)
Potassium: 3.6 mmol/L (ref 3.5–5.1)
Sodium: 130 mmol/L — ABNORMAL LOW (ref 135–145)
Total Bilirubin: 0.9 mg/dL (ref 0.3–1.2)
Total Protein: 7.5 g/dL (ref 6.5–8.1)

## 2022-10-21 LAB — CBC
HCT: 44.6 % (ref 36.0–46.0)
Hemoglobin: 15 g/dL (ref 12.0–15.0)
MCH: 29.5 pg (ref 26.0–34.0)
MCHC: 33.6 g/dL (ref 30.0–36.0)
MCV: 87.6 fL (ref 80.0–100.0)
Platelets: 345 10*3/uL (ref 150–400)
RBC: 5.09 MIL/uL (ref 3.87–5.11)
RDW: 11.8 % (ref 11.5–15.5)
WBC: 10 10*3/uL (ref 4.0–10.5)
nRBC: 0 % (ref 0.0–0.2)

## 2022-10-21 LAB — POC URINE PREG, ED: Preg Test, Ur: NEGATIVE

## 2022-10-21 LAB — CHLAMYDIA/NGC RT PCR (ARMC ONLY)
Chlamydia Tr: NOT DETECTED
N gonorrhoeae: NOT DETECTED

## 2022-10-21 LAB — URINALYSIS, ROUTINE W REFLEX MICROSCOPIC
Bilirubin Urine: NEGATIVE
Glucose, UA: >=500 mg/dL — AB
Ketones, ur: 20 mg/dL — AB
Nitrite: NEGATIVE
Protein, ur: 30 mg/dL — AB
Specific Gravity, Urine: 1.042 — ABNORMAL HIGH (ref 1.005–1.030)
Squamous Epithelial / HPF: >50 /HPF (ref 0–5)
WBC, UA: >50 WBC/hpf (ref 0–5)
pH: 5 (ref 5.0–8.0)

## 2022-10-21 LAB — WET PREP, GENITAL
Clue Cells Wet Prep HPF POC: NONE SEEN
Sperm: NONE SEEN
Trich, Wet Prep: NONE SEEN
WBC, Wet Prep HPF POC: <10 (ref <10)

## 2022-10-21 LAB — LIPASE, BLOOD: Lipase: 31 U/L (ref 11–51)

## 2022-10-21 MED ORDER — FLUCONAZOLE 50 MG PO TABS
150.0000 mg | ORAL_TABLET | Freq: Once | ORAL | Status: AC
  Administered 2022-10-21: 150 mg via ORAL
  Filled 2022-10-21: qty 1

## 2022-10-21 MED ORDER — KETOROLAC TROMETHAMINE 15 MG/ML IJ SOLN
15.0000 mg | Freq: Once | INTRAMUSCULAR | Status: AC
  Administered 2022-10-21: 15 mg via INTRAMUSCULAR
  Filled 2022-10-21: qty 1

## 2022-10-21 NOTE — ED Triage Notes (Signed)
Pt to ED via POV from home. Pt reports RLQ pain and pelvic pain that started after intercourse yesterday. Pt reports hx of ovarian cyst. Pt states when urinating cramping gets worse. Pt's gallbladder is removed.

## 2022-10-21 NOTE — Discharge Instructions (Signed)
You were treated for a yeast infection.  Your pelvic ultrasound was normal.  Please return for any new, worsening, or change in symptoms or other concerns.  It was a pleasure caring for you today.

## 2022-10-21 NOTE — ED Provider Notes (Signed)
[facility] Provider Note    Event Date/Time   First MD Initiated Contact with Patient 10/21/22 1111     (approximate)   History   Abdominal Pain   HPI  Kristin Evans is a 32 y.o. female G8 P1 who presents today for evaluation of right lower quadrant pain.  Patient reports that her pain began yesterday morning while she was having intercourse with her boyfriend.  She reports that it felt like something burst and she has pain across her low abdomen ever since.  She reports it is most concentrated in the right lower quadrant.  She reports that she has had ovarian cyst before and has a history of PCOS.  She feels nauseated and has had a couple episodes of vomiting.  No fevers or chills.  She also reports that she has burning with urination.  She has not had any vaginal discharge or bleeding.  She reports that she has the ParaGard IUD.  Most recently sexually active yesterday, declined having had any dyspareunia.     Physical Exam   Triage Vital Signs: ED Triage Vitals  Encounter Vitals Group     BP 10/21/22 1016 117/85     Systolic BP Percentile --      Diastolic BP Percentile --      Pulse Rate 10/21/22 1016 94     Resp 10/21/22 1016 18     Temp 10/21/22 1016 (!) 95.5 F (35.3 C)     Temp Source 10/21/22 1022 Oral     SpO2 10/21/22 1016 98 %     Weight 10/21/22 1016 219 lb (99.3 kg)     Height 10/21/22 1016 5\' 3"  (1.6 m)     Head Circumference --      Peak Flow --      Pain Score 10/21/22 1016 9     Pain Loc --      Pain Education --      Exclude from Growth Chart --     Most recent vital signs: Vitals:   10/21/22 1016 10/21/22 1022  BP: 117/85   Pulse: 94   Resp: 18   Temp: (!) 95.5 F (35.3 C) (!) 97.5 F (36.4 C)  SpO2: 98%     Physical Exam Vitals and nursing note reviewed.  Constitutional:      General: Awake and alert. No acute distress.    Appearance: Normal appearance. The patient is obese.  HENT:     Head:  Normocephalic and atraumatic.     Mouth: Mucous membranes are moist.  Eyes:     General: PERRL. Normal EOMs        Right eye: No discharge.        Left eye: No discharge.     Conjunctiva/sclera: Conjunctivae normal.  Cardiovascular:     Rate and Rhythm: Normal rate and regular rhythm.     Pulses: Normal pulses.  Pulmonary:     Effort: Pulmonary effort is normal. No respiratory distress.     Breath sounds: Normal breath sounds.  Abdominal:     Abdomen is soft. There is mild suprapubic and right lower quadrant abdominal tenderness. No rebound or guarding. No distention. Musculoskeletal:        General: No swelling. Normal range of motion.     Cervical back: Normal range of motion and neck supple.  Skin:    General: Skin is warm and dry.     Capillary Refill: Capillary refill takes less than 2 seconds.  Findings: No rash.  Neurological:     Mental Status: The patient is awake and alert.      ED Results / Procedures / Treatments   Labs (all labs ordered are listed, but only abnormal results are displayed) Labs Reviewed  WET PREP, GENITAL - Abnormal; Notable for the following components:      Result Value   Yeast Wet Prep HPF POC PRESENT (*)    All other components within normal limits  COMPREHENSIVE METABOLIC PANEL - Abnormal; Notable for the following components:   Sodium 130 (*)    CO2 21 (*)    Glucose, Bld 280 (*)    AST 14 (*)    All other components within normal limits  URINALYSIS, ROUTINE W REFLEX MICROSCOPIC - Abnormal; Notable for the following components:   Color, Urine YELLOW (*)    APPearance CLOUDY (*)    Specific Gravity, Urine 1.042 (*)    Glucose, UA >=500 (*)    Hgb urine dipstick SMALL (*)    Ketones, ur 20 (*)    Protein, ur 30 (*)    Leukocytes,Ua SMALL (*)    Bacteria, UA RARE (*)    All other components within normal limits  CHLAMYDIA/NGC RT PCR ([facility] ONLY)            LIPASE, BLOOD  CBC  POC URINE PREG, ED     EKG     RADIOLOGY I  independently reviewed and interpreted imaging and agree with radiologists findings.     PROCEDURES:  Critical Care performed:   Procedures   MEDICATIONS ORDERED IN ED: Medications  ketorolac (TORADOL) 15 MG/ML injection 15 mg (15 mg Intramuscular Given 10/21/22 1442)  fluconazole (DIFLUCAN) tablet 150 mg (150 mg Oral Given 10/21/22 1502)     IMPRESSION / MDM / ASSESSMENT AND PLAN / ED COURSE  I reviewed the triage vital signs and the nursing notes.   Differential diagnosis includes, but is not limited to, ovarian cyst, ovarian torsion, PID, STD, UTI, pyelonephritis, ectopic pregnancy, appendicitis.  Patient is awake and alert, hemodynamically stable and afebrile.  She is nontoxic in appearance.  Labs obtained in triage are overall reassuring.  Urinalysis appears to be dirty.  With many squamous cells.  She opted to self swab, declined pelvic exam, understands limitations of self swab over pelvic exam.  However, no dyspareunia with intercourse yesterday, less likely to be PID, though patient understands cannot evaluate for sure without pelvic exam. GC chlamydia was negative, however wet prep was positive for yeast.  She was treated with Diflucan.  Pelvic ultrasound obtained reveals no pelvic abnormalities.  No evidence of torsion or ovarian cyst.  Upon reevaluation, patient reports that she does not have any pain.  Discussed other things that may be ongoing in her abdomen including appendicitis and I recommended further workup with a CT scan.  However patient reports that she feels significantly improved and would like to be discharged home.  We discussed the risk of delayed diagnosis and delayed treatment, she understands and agrees.  We discussed return precautions and importance of close outpatient follow-up.  Patient understands and agrees with plan.  She was discharged in stable condition.   Patient's presentation is most consistent with acute complicated illness / injury requiring  diagnostic workup.   Clinical Course as of 10/21/22 1600  Wed Oct 21, 2022  1455 Patient reports that her pain has improved significantly and does not wish to have any further testing [JP]    Clinical Course User  Index [JP] Lavance Beazer, Herb Grays, PA-C     FINAL CLINICAL IMPRESSION(S) / ED DIAGNOSES   Final diagnoses:  Pelvic pain in female  Vaginal candidiasis     Rx / DC Orders   ED Discharge Orders     None        Note:  This document was prepared using Dragon voice recognition software and may include unintentional dictation errors.   Keturah Shavers 10/21/22 1601    Jene Every, MD 10/26/22 1047

## 2023-03-10 ENCOUNTER — Ambulatory Visit: Payer: MEDICAID | Admitting: Family Medicine

## 2023-05-10 ENCOUNTER — Ambulatory Visit
Admission: EM | Admit: 2023-05-10 | Discharge: 2023-05-10 | Disposition: A | Discharge status: Home / Self Care | Payer: MEDICAID | Attending: Emergency Medicine | Admitting: Emergency Medicine

## 2023-05-10 DIAGNOSIS — J069 Acute upper respiratory infection, unspecified: Secondary | ICD-10-CM

## 2023-05-10 LAB — POC COVID19/FLU A&B COMBO
Covid Antigen, POC: NEGATIVE
Influenza A Antigen, POC: NEGATIVE
Influenza B Antigen, POC: NEGATIVE

## 2023-05-10 NOTE — Discharge Instructions (Addendum)
 The COVID and flu tests are negative.   Take Tylenol or ibuprofen as needed for fever or discomfort.  Take plain Mucinex as needed for congestion.  Rest and keep yourself hydrated.    Follow-up with your primary care provider if your symptoms are not improving.

## 2023-05-10 NOTE — ED Triage Notes (Signed)
 Patient to Urgent Care with complaints of cough/ sore throat/ body aches/ loss of smell and taste/ left sided ear pain/ nasal congestion. Fever 101.3 this morning.  Symptoms started this morning.   Meds: Advil/ tylenol/ Excedrin.

## 2023-05-10 NOTE — ED Provider Notes (Signed)
 Renaldo Fiddler    CSN: [id number] Arrival date & time: 05/10/23  1707      History   Chief Complaint Chief Complaint  Patient presents with   Cough   Sore Throat   Generalized Body Aches    HPI AAYLIAH ROTENBERRY is a 33 y.o. female.  Patient presents with fever, body aches, congestion, earache, sore throat, cough since this morning.  Treatment attempted with Tylenol, ibuprofen, Mucinex.  No shortness of breath, vomiting, diarrhea.  The history is provided by the patient and medical records.    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 Authorizing Provider  acetaminophen (TYLENOL) 325 MG tablet Take 2 tablets (650 mg total) by mouth every 4 (four) hours as needed (for pain scale < 4  OR  temperature  >/=  100.5 F). 02/19/20   Gustavo Lah, CNM  albuterol (VENTOLIN HFA) 108 (90 Base) MCG/ACT inhaler Inhale 1-2 puffs into the lungs every 6 (six) hours as needed (cough). Patient not taking: Reported on 09/11/2022 11/04/20   Amalia Greenhouse, FNP  cephALEXin (KEFLEX) 500 MG capsule Take 1 capsule (500 mg total) by mouth 2 (two) times daily. Patient not taking: Reported on 08/07/2022 05/09/22    Ward, Layla Maw, DO  clotrimazole (GYNE-LOTRIMIN) 1 % vaginal cream Place 1 Applicatorful vaginally at bedtime. Patient not taking: Reported on 08/07/2022 05/05/21   Merrilee Jansky, MD  cyclobenzaprine (FLEXERIL) 10 MG tablet Take 1 tablet (10 mg total) by mouth 2 (two) times daily as needed for muscle spasms. Patient not taking: Reported on 09/11/2022 08/07/22   Mickie Bail, NP  fluconazole (DIFLUCAN) 150 MG tablet Take 1 tablet every 3 days for 3 doses Patient not taking: Reported on 08/07/2022 01/01/21   Phineas Semen, MD  metFORMIN (GLUCOPHAGE) 1000 MG tablet Take 1 tablet (1,000 mg total) by mouth 2 (two) times daily with a meal. 01/01/21 01/01/22  Phineas Semen, MD  metFORMIN (GLUCOPHAGE-XR) 500 MG 24 hr tablet Take 1 tablet (500 mg total) by mouth daily with breakfast. 05/10/20 05/10/21  Haroldine Laws, CNM  Prenatal Vit-Fe Fumarate-FA (MULTIVITAMIN-PRENATAL) 27-0.8 MG TABS tablet Take 1 tablet by mouth daily at 12 noon. Patient not taking: Reported on 09/11/2022    [provider]    Family History Family History  Family history unknown: Yes    Social History Social History   Tobacco Use   Smoking status: Former  Smokeless tobacco: Never  Vaping Use   Vaping status: Every Day  Substance Use Topics   Alcohol use: No   Drug use: No     Allergies   Pravastatin   Review of Systems Review of Systems  Constitutional:  Positive for fever. Negative for chills.  HENT:  Positive for congestion, ear pain, rhinorrhea and sore throat.   Respiratory:  Positive for cough. Negative for shortness of breath.   Gastrointestinal:  Negative for diarrhea and vomiting.     Physical Exam Triage Vital Signs ED Triage Vitals [05/10/23 1722]  Encounter Vitals Group     BP 126/87     Systolic BP Percentile      Diastolic BP Percentile      Pulse Rate (!) 102     Resp 18     Temp 98.1 F (36.7 C)     Temp src      SpO2 97 %     Weight      Height      Head  Circumference      Peak Flow      Pain Score 6     Pain Loc      Pain Education      Exclude from Growth Chart    No data found.  Updated Vital Signs BP 126/87   Pulse (!) 102   Temp 98.1 F (36.7 C)   Resp 18   SpO2 97%   Visual Acuity Right Eye Distance:   Left Eye Distance:   Bilateral Distance:    Right Eye Near:   Left Eye Near:    Bilateral Near:     Physical Exam Constitutional:      General: She is not in acute distress. HENT:     Right Ear: Tympanic membrane normal.     Left Ear: Tympanic membrane normal.     Nose: Rhinorrhea present.     Mouth/Throat:     Mouth: Mucous membranes are moist.     Pharynx: Oropharynx is clear.  Cardiovascular:     Rate and Rhythm: Normal rate and regular rhythm.     Heart sounds: Normal heart sounds.  Pulmonary:     Effort: Pulmonary effort is normal. No respiratory distress.     Breath sounds: Normal breath sounds.  Neurological:     Mental Status: She is alert.      UC Treatments / Results  Labs (all labs ordered are listed, but only abnormal results are displayed) Labs Reviewed  POC COVID19/FLU A&B COMBO    EKG   Radiology No results found.  Procedures Procedures (including critical care time)  Medications Ordered in UC Medications - No data to display  Initial Impression / Assessment and Plan / UC Course  I have reviewed the triage vital signs and the nursing notes.  Pertinent labs & imaging results that were available during my care of the patient were reviewed by me and considered in my medical decision making (see chart for details).    Viral URI.  Rapid COVID and flu negative.  Discussed symptomatic treatment including Tylenol or ibuprofen as needed for fever or discomfort, plain Mucinex as needed for congestion, rest, hydration.  Instructed patient to follow-up with PCP if not improving.  ED precautions given.  Patient agrees to plan of care.  Final Clinical Impressions(s) / UC Diagnoses    Final diagnoses:  Viral URI     Discharge Instructions      The COVID and flu tests are  negative.   Take Tylenol or ibuprofen as needed for fever or discomfort.  Take plain Mucinex as needed for congestion.  Rest and keep yourself hydrated.    Follow-up with your primary care provider if your symptoms are not improving.         ED Prescriptions   None    PDMP not reviewed this encounter.   Mickie Bail, NP 05/10/23 [phone]

## 2023-05-14 ENCOUNTER — Ambulatory Visit: Payer: MEDICAID | Admitting: Pediatrics

## 2023-07-22 ENCOUNTER — Ambulatory Visit
Admission: EM | Admit: 2023-07-22 | Discharge: 2023-07-22 | Disposition: A | Discharge status: Home / Self Care | Payer: MEDICAID | Attending: Emergency Medicine | Admitting: Emergency Medicine

## 2023-07-22 DIAGNOSIS — N898 Other specified noninflammatory disorders of vagina: Secondary | ICD-10-CM | POA: Insufficient documentation

## 2023-07-22 DIAGNOSIS — Z7984 Long term (current) use of oral hypoglycemic drugs: Secondary | ICD-10-CM | POA: Diagnosis not present

## 2023-07-22 DIAGNOSIS — E1165 Type 2 diabetes mellitus with hyperglycemia: Secondary | ICD-10-CM | POA: Diagnosis not present

## 2023-07-22 DIAGNOSIS — B349 Viral infection, unspecified: Secondary | ICD-10-CM | POA: Insufficient documentation

## 2023-07-22 LAB — POCT FASTING CBG KUC MANUAL ENTRY: POCT Glucose (KUC): 242 mg/dL — AB (ref 70–99)

## 2023-07-22 LAB — POCT URINE PREGNANCY: Preg Test, Ur: NEGATIVE

## 2023-07-22 MED ORDER — ONDANSETRON 4 MG PO TBDP: 4.0000 mg | ORAL_TABLET | Freq: Three times a day (TID) | ORAL | 0 refills | Status: AC | PRN

## 2023-07-22 MED ORDER — METFORMIN HCL 500 MG PO TABS: 500.0000 mg | ORAL_TABLET | Freq: Every day | ORAL | 2 refills | Status: DC

## 2023-07-22 MED ORDER — FLUCONAZOLE 150 MG PO TABS: 150.0000 mg | ORAL_TABLET | ORAL | 0 refills | Status: DC

## 2023-07-22 MED ORDER — DIPHENOXYLATE-ATROPINE 2.5-0.025 MG PO TABS: 1.0000 | ORAL_TABLET | Freq: Four times a day (QID) | ORAL | 0 refills | Status: DC | PRN

## 2023-07-22 MED ORDER — METRONIDAZOLE 500 MG PO TABS: 500.0000 mg | ORAL_TABLET | Freq: Two times a day (BID) | ORAL | 0 refills | Status: DC

## 2023-07-22 NOTE — Discharge Instructions (Signed)
-  Your symptoms today are most likely being caused by a virus and should steadily improve in time it can take up to 7 to 10 days before you truly start to see a turnaround however things will get better - May use Lomotil every 6 hours as needed for diarrhea - May use Zofran  every 8 hours for nausea and vomiting, place under tongue and allow to dissolve   -You can take Tylenol  and/or Ibuprofen  as needed for fever reduction and pain relief.  -For cough: honey 1/2 to 1 teaspoon (you can dilute the honey in water or another fluid).  You can also use guaifenesin  and dextromethorphan for cough. You can use a humidifier for chest congestion and cough.  If you don't have a humidifier, you can sit in the bathroom with the hot shower running.     -For sore throat: try warm salt water gargles, cepacol lozenges, throat spray, warm tea or water with lemon/honey, popsicles or ice, or OTC cold relief medicine for throat discomfort.  -For congestion: take a daily anti-histamine like Zyrtec, Claritin, and a oral decongestant, such as pseudoephedrine.  You can also use Flonase 1-2 sprays in each nostril daily.  -It is important to stay hydrated: drink plenty of fluids (water, gatorade/powerade/pedialyte, juices, or teas) to keep your throat moisturized and help further relieve irritation/discomfort.   Diabetes - Blood sugar in clinic is 242 which is elevated - Restarting her metformin  taken once daily, starting at a lower dose so your body can adjust, medicine will be adjusted by your primary doctor at upcoming appointment - Please follow diabetic diet, information listed and side, avoid carbohydrates, fatty and fried foods as well as high sugar intake - At any point if your blood sugars elevated and you are experiencing nausea and vomiting without a cold, visual changes, shortness of breath please go to the nearest emergency department for immediate evaluation  For your vaginal discharge - Treating today for  bacterial vaginosis and yeast - Take metronidazole twice daily for 7 days, avoid use of alcohol during treatment as it will make you sick - Take 1 Diflucan  tablet when you receive your medicine then take second dose after completing antibiotic - Vaginal swab checking for yeast, bacterial vaginosis, trichomoniasis, gonorrhea and chlamydia pending for 2 to 3 days, you will be notified of positive test results only, please refrain from sexual intercourse during treatment and until all symptoms have resolved

## 2023-07-22 NOTE — ED Provider Notes (Signed)
 Kristin Evans    CSN: [id number] Arrival date & time: 07/22/23  0846      History   Chief Complaint Chief Complaint  Patient presents with   Vaginal Itching   Nasal Congestion    HPI Kristin Evans is a 33 y.o. female.   Patient presents for evaluation of intermittent Maribell Demeo thick vaginal discharge with itching and a mild odor present for 2 months.  Has attempted use of over-the-counter Monistat 3-day and 7-day, symptoms resolved before returning.  Believes symptoms have been reoccurring as she ran out of her diabetes medication, endorses sugars as high as 500 at home.  Denies symptoms of DKA.  Sexually active, no known exposure, no concern for STD.  Patient concerned with nasal congestion, diarrhea, nausea and vomiting beginning 2 days ago.  Unable to tolerate food and liquids.  Has attempted use of Imodium and an additional over-the-counter medication for diarrhea which has been ineffective.  Known sick contacts at work with same symptoms.  Denies fever.  Experiences cough at baseline, has not worsened.  Denies shortness of breath or wheezing.  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  Authorizing Provider  diphenoxylate-atropine (LOMOTIL) 2.5-0.025 MG tablet Take 1 tablet by mouth 4 (four) times daily as needed for diarrhea or loose stools. 07/22/23  Yes Antoinette Haskett R, NP  fluconazole  (DIFLUCAN ) 150 MG tablet Take 1 tablet (150 mg total) by mouth once a week for 2 doses. 07/22/23 07/30/23 Yes Keyonni Percival R, NP  metroNIDAZOLE (FLAGYL) 500 MG tablet Take 1 tablet (500 mg total) by mouth 2 (two) times daily. 07/22/23  Yes Analea Muller R, NP  ondansetron  (ZOFRAN -ODT) 4 MG disintegrating tablet Take 1 tablet (4 mg total) by mouth every 8 (eight) hours as needed. 07/22/23  Yes Ondra Deboard R, NP  acetaminophen  (TYLENOL ) 325 MG tablet Take 2 tablets (650 mg total) by mouth every 4 (four) hours as needed (for pain scale < 4  OR  temperature  >/=  100.5 F). 02/19/20   Teodora Fell, CNM  albuterol  (VENTOLIN  HFA) [phone] Base) MCG/ACT inhaler Inhale 1-2 puffs into the lungs every 6 (six) hours as needed (cough). Patient not taking: Reported on 09/11/2022 11/04/20   Boddu, Kristin, FNP  cephALEXin  (KEFLEX ) 500 MG capsule Take 1 capsule (500 mg total) by mouth 2 (two) times daily. Patient not taking: Reported on 08/07/2022 05/09/22   Ward, Starling Eck  N, DO  clotrimazole  (GYNE-LOTRIMIN ) 1 % vaginal cream Place 1 Applicatorful vaginally at bedtime. Patient not taking: Reported on 08/07/2022 05/05/21   Corine Dice, MD  cyclobenzaprine  (FLEXERIL ) 10 MG tablet Take 1 tablet (10 mg total) by mouth 2 (two) times daily as needed for muscle spasms. Patient not taking: Reported on 09/11/2022 08/07/22   Wellington Half, NP  metFORMIN  (GLUCOPHAGE ) 500 MG tablet Take 1 tablet (500 mg total) by mouth daily. 07/22/23   Reena Canning, NP  metFORMIN  (GLUCOPHAGE -XR) 500 MG 24 hr tablet Take 1 tablet (500 mg total) by mouth daily with breakfast. 05/10/20 05/10/21  Collin Deal, CNM  Prenatal Vit-Fe Fumarate-FA (MULTIVITAMIN-PRENATAL) 27-0.8 MG TABS tablet Take 1 tablet by mouth daily at 12 noon. Patient  not taking: Reported on 09/11/2022    [provider]    Family History Family History  Family history unknown: Yes    Social History Social History   Tobacco Use   Smoking status: Former   Smokeless tobacco: Never  Vaping Use   Vaping status: Every Day  Substance Use Topics   Alcohol use: No   Drug use: No     Allergies   Pravastatin   Review of Systems Review of Systems   Physical Exam Triage Vital Signs ED Triage Vitals  Encounter Vitals Group     BP 07/22/23 1000 123/75     Systolic BP Percentile --      Diastolic BP Percentile --      Pulse Rate 07/22/23 1000 91     Resp 07/22/23 1000 16     Temp 07/22/23 1000 98.7 F (37.1 C)     Temp Source 07/22/23 1000 Oral     SpO2 07/22/23 1000 99 %     Weight --      Height --      Head Circumference --      Peak Flow --      Pain Score 07/22/23 1001 10     Pain Loc --      Pain Education --      Exclude from Growth Chart --    No data found.  Updated Vital Signs BP 123/75 (BP Location: Left Arm)   Pulse 91   Temp 98.7 F (37.1 C) (Oral)   Resp 16   LMP 06/15/2023 (Approximate)   SpO2 99%   Visual Acuity Right Eye Distance:   Left Eye Distance:   Bilateral Distance:    Right Eye Near:   Left Eye Near:    Bilateral Near:     Physical Exam Constitutional:      Appearance: Normal appearance.  HENT:     Right Ear: Tympanic membrane, ear canal and external ear normal.     Left Ear: Ear canal and external ear normal.     Nose: Congestion present.     Mouth/Throat:     Pharynx: Oropharynx is clear. No oropharyngeal exudate or posterior oropharyngeal erythema.  Eyes:     Extraocular Movements: Extraocular movements intact.  Cardiovascular:     Rate and Rhythm: Normal rate and regular rhythm.     Pulses: Normal pulses.     Heart sounds: Normal heart sounds.  Pulmonary:     Effort: Pulmonary effort is normal.     Breath sounds: Normal breath sounds.  Abdominal:     Tenderness: There  is no abdominal tenderness. There is no right CVA tenderness, left CVA tenderness or guarding.  Genitourinary:    Comments: deferred Musculoskeletal:  Cervical back: Normal range of motion and neck supple.  Neurological:     Mental Status: She is alert and oriented to person, place, and time. Mental status is at baseline.      UC Treatments / Results  Labs (all labs ordered are listed, but only abnormal results are displayed) Labs Reviewed  POCT FASTING CBG [facility] MANUAL ENTRY - Abnormal; Notable for the following components:      Result Value   POCT Glucose ([facility]) 242 (*)    All other components within normal limits  POCT URINE PREGNANCY - Normal  CERVICOVAGINAL ANCILLARY ONLY    EKG   Radiology No results found.  Procedures Procedures (including critical care time)  Medications Ordered in UC Medications - No data to display  Initial Impression / Assessment and Plan / UC Course  I have reviewed the triage vital signs and the nursing notes.  Pertinent labs & imaging results that were available during my care of the patient were reviewed by me and considered in my medical decision making (see chart for details).  Vaginal discharge, uncontrolled type 2 diabetes with hyperglycemia, viral illness   STI panel pending treating empirically for yeast and BV, prescribed metronidazole and Diflucan  and discussed administration, advised abstaining from sexual intercourse and alcohol during treatment, recommended additional supportive care, reoccurring yeast most likely related to uncontrolled diabetes, point-of-care CBG 242 in office, no symptoms or signs of DKA, stable for outpatient management, restarted metformin  at lower dose of 500 mg daily, has upcoming PCP appointment in 2 months, advised diabetic diet and given written handout, given instructions for hyperglycemia and signs of DKA to go to the nearest emergency department for management  Patient is in no signs of distress nor  toxic appearing.  Vital signs are stable.  Low suspicion for pneumonia, pneumothorax or bronchitis and therefore will defer imaging.  Known sick contacts with same symptoms, etiology most likely viral.  Prescribed Lomotil and Zofran , PDMP reviewed, low risk.  Recommend increase fluid intake until able to tolerate food at baseline.May use additional over-the-counter medications as needed for supportive care.  May follow-up with urgent care as needed if symptoms persist or worsen.  Note given.   Final Clinical Impressions(s) / UC Diagnoses   Final diagnoses:  Uncontrolled type 2 diabetes mellitus with hyperglycemia (HCC)  Vaginal discharge  Viral illness   Discharge Instructions      -Your symptoms today are most likely being caused by a virus and should steadily improve in time it can take up to 7 to 10 days before you truly start to see a turnaround however things will get better - May use Lomotil every 6 hours as needed for diarrhea - May use Zofran  every 8 hours for nausea and vomiting, place under tongue and allow to dissolve   -You can take Tylenol  and/or Ibuprofen  as needed for fever reduction and pain relief.  -For cough: honey 1/2 to 1 teaspoon (you can dilute the honey in water or another fluid).  You can also use guaifenesin  and dextromethorphan for cough. You can use a humidifier for chest congestion and cough.  If you don't have a humidifier, you can sit in the bathroom with the hot shower running.     -For sore throat: try warm salt water gargles, cepacol lozenges, throat spray, warm tea or water with lemon/honey, popsicles or ice, or OTC cold relief medicine for throat discomfort.  -For congestion: take a daily anti-histamine like Zyrtec, Claritin, and a oral decongestant, such as  pseudoephedrine.  You can also use Flonase 1-2 sprays in each nostril daily.  -It is important to stay hydrated: drink plenty of fluids (water, gatorade/powerade/pedialyte, juices, or teas) to keep your  throat moisturized and help further relieve irritation/discomfort.   Diabetes - Blood sugar in clinic is 242 which is elevated - Restarting her metformin  taken once daily, starting at a lower dose so your body can adjust, medicine will be adjusted by your primary doctor at upcoming appointment - Please follow diabetic diet, information listed and side, avoid carbohydrates, fatty and fried foods as well as high sugar intake - At any point if your blood sugars elevated and you are experiencing nausea and vomiting without a cold, visual changes, shortness of breath please go to the nearest emergency department for immediate evaluation  For your vaginal discharge - Treating today for bacterial vaginosis and yeast - Take metronidazole twice daily for 7 days, avoid use of alcohol during treatment as it will make you sick - Take 1 Diflucan  tablet when you receive your medicine then take second dose after completing antibiotic - Vaginal swab checking for yeast, bacterial vaginosis, trichomoniasis, gonorrhea and chlamydia pending for 2 to 3 days, you will be notified of positive test results only, please refrain from sexual intercourse during treatment and until all symptoms have resolved    ED Prescriptions     Medication Sig Dispense Auth. Provider   metFORMIN  (GLUCOPHAGE ) 500 MG tablet Take 1 tablet (500 mg total) by mouth daily. 30 tablet Carena Stream R, NP   ondansetron  (ZOFRAN -ODT) 4 MG disintegrating tablet Take 1 tablet (4 mg total) by mouth every 8 (eight) hours as needed. 20 tablet Bill Mcvey R, NP   diphenoxylate-atropine (LOMOTIL) 2.5-0.025 MG tablet Take 1 tablet by mouth 4 (four) times daily as needed for diarrhea or loose stools. 15 tablet Elbie Statzer R, NP   metroNIDAZOLE (FLAGYL) 500 MG tablet Take 1 tablet (500 mg total) by mouth 2 (two) times daily. 14 tablet Litzy Dicker R, NP   fluconazole  (DIFLUCAN ) 150 MG tablet Take 1 tablet (150 mg total) by mouth once a week  for 2 doses. 2 tablet Sayla Golonka R, NP      I have reviewed the PDMP during this encounter.   Reena Canning, NP 07/22/23 1044

## 2023-07-22 NOTE — ED Triage Notes (Signed)
 Congestion, diarrhea x 2 days. Vaginal odor, discharge, itching and burning x 4 days. Denies urinary frequency or uti like symptoms.   Pt states her IUD fell out 2 months ago and would like to have a pregnancy test.

## 2023-07-23 LAB — CERVICOVAGINAL ANCILLARY ONLY
Bacterial Vaginitis (gardnerella): POSITIVE — AB
Candida Glabrata: NEGATIVE
Candida Vaginitis: POSITIVE — AB
Chlamydia: NEGATIVE
Comment: NEGATIVE
Comment: NEGATIVE
Comment: NEGATIVE
Comment: NEGATIVE
Comment: NEGATIVE
Comment: NORMAL
Neisseria Gonorrhea: NEGATIVE
Trichomonas: NEGATIVE

## 2023-07-24 ENCOUNTER — Ambulatory Visit (HOSPITAL_COMMUNITY): Payer: Self-pay

## 2023-07-30 ENCOUNTER — Telehealth: Payer: Self-pay | Admitting: Emergency Medicine

## 2023-07-30 MED ORDER — FLUCONAZOLE 150 MG PO TABS: 150.0000 mg | ORAL_TABLET | ORAL | 0 refills | Status: DC | PRN

## 2023-07-30 NOTE — Telephone Encounter (Signed)
 Patient notified clinic of concerns of persisting burning with urination, was diagnosed with yeast and bacterial vaginosis, completed medication as directed, will prescribe additional yeast medicine due to recent antibiotic use, most likely has not resolved as well as patient's diabetes is uncontrolled at this time, advise reevaluation if symptoms persist past additional use of medicine

## 2023-08-18 ENCOUNTER — Encounter: Payer: Self-pay | Admitting: Emergency Medicine

## 2023-08-18 ENCOUNTER — Ambulatory Visit
Admission: EM | Admit: 2023-08-18 | Discharge: 2023-08-18 | Disposition: A | Discharge status: Home / Self Care | Payer: MEDICAID | Attending: Emergency Medicine | Admitting: Emergency Medicine

## 2023-08-18 DIAGNOSIS — M5442 Lumbago with sciatica, left side: Secondary | ICD-10-CM | POA: Diagnosis not present

## 2023-08-18 DIAGNOSIS — M5441 Lumbago with sciatica, right side: Secondary | ICD-10-CM

## 2023-08-18 MED ORDER — PREDNISONE 10 MG (21) PO TBPK: ORAL_TABLET | Freq: Every day | ORAL | 0 refills | Status: DC

## 2023-08-18 MED ORDER — CYCLOBENZAPRINE HCL 10 MG PO TABS: 10.0000 mg | ORAL_TABLET | Freq: Every day | ORAL | 0 refills | Status: DC

## 2023-08-18 MED ORDER — KETOROLAC TROMETHAMINE 30 MG/ML IJ SOLN
30.0000 mg | Freq: Once | INTRAMUSCULAR | Status: AC
  Administered 2023-08-18: 30 mg via INTRAMUSCULAR

## 2023-08-18 NOTE — ED Provider Notes (Signed)
 Kristin Evans    CSN: [id number] Arrival date & time: 08/18/23  0803      History   Chief Complaint Chief Complaint  Patient presents with   Back Pain    HPI Kristin Evans is a 33 y.o. female.   Patient presents for evaluation of left lower back pain radiating down the bilateral lower extremities with numbness and tingling present for 7 days.  Initially was occurring intermittently but now has been constant.  Exacerbated by all movement but able to bear weight to the lower extremities.  Endorses symptoms started after pulling out her couch.  Has attempted use of Tylenol  and Advil  with minimal relief.  Denies urinary symptoms.  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 Authorizing Provider  cyclobenzaprine  (FLEXERIL ) 10 MG tablet Take 1 tablet (10 mg total) by mouth at bedtime. 08/18/23  Yes Memory Heinrichs R, NP  predniSONE (STERAPRED UNI-PAK 21 TAB) 10 MG (21) TBPK tablet Take by mouth daily. Take 6 tabs by mouth daily  for 1 days, then 5 tabs for 1 days, then 4 tabs for 1 days, then 3 tabs for 1 days, 2 tabs for 1 days, then 1 tab by mouth daily for 1 days 08/18/23  Yes Cyntia Staley  R, NP  acetaminophen  (TYLENOL ) 325 MG tablet Take 2 tablets (650 mg total) by mouth every 4 (four) hours as needed (for pain scale < 4  OR  temperature  >/=  100.5 F). 02/19/20   Vernel Therisa HERO, CNM  albuterol  (VENTOLIN  HFA) 108 [phone] Base) MCG/ACT inhaler Inhale 1-2 puffs into the lungs every 6 (six) hours as needed (cough). Patient not taking: Reported on 09/11/2022 11/04/20   Boddu, Kristin, FNP  cephALEXin  (KEFLEX ) 500 MG capsule Take 1 capsule (500 mg total) by mouth 2 (two) times daily. Patient not taking: Reported on 08/07/2022 05/09/22   Ward, Josette SAILOR, DO  clotrimazole  (GYNE-LOTRIMIN ) 1 % vaginal cream Place 1 Applicatorful vaginally at bedtime. Patient not taking: Reported on 08/07/2022 05/05/21   Blaise Aleene KIDD, MD  diphenoxylate -atropine  (LOMOTIL ) 2.5-0.025 MG tablet Take 1 tablet by mouth 4 (four) times daily as needed for diarrhea or loose stools. 07/22/23   Isidro Monks, Shelba SAUNDERS, NP  fluconazole  (DIFLUCAN ) 150 MG tablet Take 1 tablet (150 mg total) by mouth every three (3) days as needed for up to 2 doses. 07/30/23   Teresa Shelba SAUNDERS, NP  metFORMIN  (  GLUCOPHAGE ) 500 MG tablet Take 1 tablet (500 mg total) by mouth daily. 07/22/23   Teresa Shelba SAUNDERS, NP  metFORMIN  (GLUCOPHAGE -XR) 500 MG 24 hr tablet Take 1 tablet (500 mg total) by mouth daily with breakfast. 05/10/20 05/10/21  Myron Nest, CNM  metroNIDAZOLE  (FLAGYL ) 500 MG tablet Take 1 tablet (500 mg total) by mouth 2 (two) times daily. 07/22/23   Allayna Erlich, Shelba SAUNDERS, NP  ondansetron  (ZOFRAN -ODT) 4 MG disintegrating tablet Take 1 tablet (4 mg total) by mouth every 8 (eight) hours as needed. 07/22/23   Brayam Boeke, Shelba SAUNDERS, NP  Prenatal Vit-Fe Fumarate-FA (MULTIVITAMIN-PRENATAL) 27-0.8 MG TABS tablet Take 1 tablet by mouth daily at 12 noon. Patient not taking: Reported on 09/11/2022    [provider]    Family History Family History  Family history unknown: Yes    Social History Social History   Tobacco Use   Smoking status: Former    Smokeless tobacco: Never  Vaping Use   Vaping status: Every Day  Substance Use Topics   Alcohol use: No   Drug use: No     Allergies   Pravastatin   Review of Systems Review of Systems   Physical Exam Triage Vital Signs ED Triage Vitals  Encounter Vitals Group     BP 08/18/23 0816 118/82     Girls Systolic BP Percentile --      Girls Diastolic BP Percentile --      Boys Systolic BP Percentile --      Boys Diastolic BP Percentile --      Pulse Rate 08/18/23 0816 92     Resp 08/18/23 0816 20     Temp 08/18/23 0816 98.8 F (37.1 C)     Temp Source 08/18/23 0816 Oral     SpO2 08/18/23 0814 100 %     Weight --      Height --      Head Circumference --      Peak Flow --      Pain Score 08/18/23 0813 7     Pain Loc --      Pain Education --      Exclude from Growth Chart --    No data found.  Updated Vital Signs BP 118/82 (BP Location: Left Arm)   Pulse 92   Temp 98.8 F (37.1 C) (Oral)   Resp 20   LMP 07/06/2023 (Approximate)   SpO2 100%   Visual Acuity Right Eye Distance:   Left Eye Distance:   Bilateral Distance:    Right Eye Near:   Left Eye Near:    Bilateral Near:     Physical Exam Constitutional:      Appearance: Normal appearance.   Eyes:     Extraocular Movements: Extraocular movements intact.   Pulmonary:     Effort: Pulmonary effort is normal.   Musculoskeletal:     Comments: Tenderness present to the bilateral lumbar region of the back without point tenderness or spinal tenderness, no ecchymosis swelling or deformity, able to sit erect without complication, pain elicited when changing positions but able to complete range of motion, positive straight leg test bilaterally   Neurological:     Mental Status: She is alert and oriented to person, place, and time. Mental status is at baseline.      UC Treatments / Results  Labs (all labs ordered are listed, but only abnormal results are displayed) Labs Reviewed - No data to  display  EKG   Radiology No results found.  Procedures Procedures (including critical care time)  Medications Ordered in UC Medications - No data to display  Initial Impression / Assessment and Plan / UC Course  I have reviewed the triage vital signs and the nursing notes.  Pertinent labs & imaging results that were available during my care of the patient were reviewed by me and considered in my medical decision making (see chart for details).  Acute bilateral low back pain with bilateral sciatica  Etiology most likely muscular, no spinal tenderness and no known injury therefore imaging deferred, stable for outpatient management, Toradol  IM given and prescribed prednisone and Flexeril  for home use with supportive care through [facility], heat massage stretching with activity as tolerated, advised orthopedic follow-up if symptoms continue to persist Final Clinical Impressions(s) / UC Diagnoses   Final diagnoses:  Acute bilateral low back pain with bilateral sciatica     Discharge Instructions      Your pain is most likely caused by irritation to the muscles .  You have been given an injection of Toradol  to reduce inflammation and help with pain and till you see relief within the hour  Take  prednisone every morning with food to continue the above process, you may take Tylenol  or any topical medicines additionally  You may use muscle relaxer at bedtime as needed for additional comfort  You may use heating pad in 15 minute intervals as needed for additional comfort  Begin stretching affected area daily for 10 minutes as tolerated to further loosen muscles   When lying down place pillow underneath and between knees for support  Can try sleeping without pillow on firm mattress   Practice good posture: head back, shoulders back, chest forward, pelvis back and weight distributed evenly on both legs  If pain persist after recommended treatment or reoccurs if may be beneficial to  follow up with orthopedic specialist for evaluation, this doctor specializes in the bones and can manage your symptoms long-term with options such as but not limited to imaging, medications or physical therapy      ED Prescriptions     Medication Sig Dispense Auth. Provider   predniSONE (STERAPRED UNI-PAK 21 TAB) 10 MG (21) TBPK tablet Take by mouth daily. Take 6 tabs by mouth daily  for 1 days, then 5 tabs for 1 days, then 4 tabs for 1 days, then 3 tabs for 1 days, 2 tabs for 1 days, then 1 tab by mouth daily for 1 days 21 tablet Benelli Winther R, NP   cyclobenzaprine  (FLEXERIL ) 10 MG tablet Take 1 tablet (10 mg total) by mouth at bedtime. 10 tablet Bryne Lindon R, NP      PDMP not reviewed this encounter.   Teresa Shelba SAUNDERS, [place] 08/18/23 [phone]

## 2023-08-18 NOTE — ED Triage Notes (Signed)
 Patient complains of lower back pain  on left side x 1 week. States she was pulling out her couch and felt a pull in her back yesterday. Tylenol  650 mg PO at 10:00 pm and Advil  400 mg PO at 10:00. Rates pain 7/10.

## 2023-08-18 NOTE — Discharge Instructions (Signed)
 Your pain is most likely caused by irritation to the muscles .  You have been given an injection of Toradol  to reduce inflammation and help with pain and till you see relief within the hour  Take  prednisone every morning with food to continue the above process, you may take Tylenol  or any topical medicines additionally  You may use muscle relaxer at bedtime as needed for additional comfort  You may use heating pad in 15 minute intervals as needed for additional comfort  Begin stretching affected area daily for 10 minutes as tolerated to further loosen muscles   When lying down place pillow underneath and between knees for support  Can try sleeping without pillow on firm mattress   Practice good posture: head back, shoulders back, chest forward, pelvis back and weight distributed evenly on both legs  If pain persist after recommended treatment or reoccurs if may be beneficial to follow up with orthopedic specialist for evaluation, this doctor specializes in the bones and can manage your symptoms long-term with options such as but not limited to imaging, medications or physical therapy

## 2023-11-05 DIAGNOSIS — O09899 Supervision of other high risk pregnancies, unspecified trimester: Secondary | ICD-10-CM | POA: Insufficient documentation

## 2023-11-05 NOTE — Progress Notes (Signed)
 HPI: The patient is here after a positive home pregnancy test.  She is a 33 y.o., female, G3P1011.  She is currently experiencing nausea, fatigue, and moodiness and sore breasts and denies bleeding, contractions, cramping or leaking.   Mertie had a period 09/07/22 but she hasn't had one since. She usually has regular every 28-30 days  . She was not using any kind of contraception. This was a unplanned pregnancy.   The following portions of the patient's history were reviewed and updated as appropriate: Past Medical History:  has a past medical history of Gestational hypertension (HHS-HCC) (05/09/2020), History of obesity (04/11/2016), Hyperlipidemia, mixed (04/11/2016), Microalbuminuria (04/11/2016), PCOS (polycystic ovarian syndrome), and Type 2 diabetes mellitus not at goal (CMS/HHS-HCC) (04/11/2016).  Past Surgical History:  has no past surgical history on file. Family History: family history includes Alcohol abuse in her mother; Bipolar disorder in her brother; Mental illness in her sister; Schizophrenia in her brother. She was adopted. Social History:  reports that she quit smoking about 6 years ago. Her smoking use included cigarettes. She has never used smokeless tobacco. She reports that she does not drink alcohol and does not use drugs. Current Meds: has a current medication list which includes the following prescription(s): blood glucose diagnostic, blood glucose meter, lancets, metformin , prenatal vitamin with iron-folic acid, cephalexin , ibuprofen , lancing device with lancets, liletta, oxycodone -acetaminophen , and syringe with needle.  Allergies: is allergic to cherry and pravachol [pravastatin].  OB history:  OB History  Gravida Para Term Preterm AB Living  3 1 1  1 1   SAB IAB Ectopic Molar Multiple Live Births  1     1    # Outcome Date GA Lbr Len/2nd Weight Sex Type Anes PTL Lv  3 Current           2 Term 05/07/20 [redacted]w[redacted]d / 00:50 3.04 kg (6 lb 11.2 oz) F Vag-Spont EPI  LIV  1 SAB              Complications: see below  Patient Active Problem List  Diagnosis  . PCOS (polycystic ovarian syndrome)  . Microalbuminuria  . Hyperlipidemia, mixed  . Type 2 diabetes mellitus not at goal (CMS/HHS-HCC)  . History of obesity  . Hyperlipidemia  . CCC (chronic calculous cholecystitis)  . Amenorrhea  . Vaginal bleeding in pregnancy, first trimester (HHS-HCC)  . Supervision of other high risk pregnancy, antepartum (HHS-HCC)    Review of Systems Pertinent positives and negatives in the HPI, otherwise an 8-system review of systems was negative.     Physical Exam: Vitals:   11/05/23 1007  BP: 116/79  Pulse: 84  Weight: (!) 107.5 kg (237 lb)  Height: 160 cm (5' 3)     BP 116/79   Pulse 84   Ht 160 cm (5' 3)   Wt (!) 107.5 kg (237 lb)   LMP 09/07/2023 (Exact Date)   BMI 41.98 kg/m    General Appearance:    Well-developed, well-nourished, no acute distress, appears stated age  Lungs:     Respirations unlabored  Neuro: Psych: Uterus:   Alert, oriented x3  Appropriate mood and insight, judgement intact  Enlarged, c/w [redacted] week gestation    ULTRASOUND REPORT  Location: [facility] Date of Service: 11/05/2023   Indications:dating Findings:  Singleton intrauterine pregnancy is visualized with a CRL consistent with [redacted]w[redacted]d gestation, giving an (U/S) EDD of 06/13/24. FHR: 162 BPM CRL measurement: 19.7 mm Yolk sac is visualized and appears abnormal. Gestational Sac: visualized and appears  normal  CL: 3.4 cm  Right Ovary is normal in appearance. Left Ovary is normal appearance. Corpus luteal cyst:  is not visualized Survey of the adnexa demonstrates no adnexal masses. There is no free peritoneal fluid in the cul de sac.  Impression: 1. [redacted]w[redacted]d Viable Singleton Intrauterine pregnancy by U/S.  Assessment:   Amenorrhea  (primary encounter diagnosis)  Pregnancy with type 2 diabetes mellitus in first trimester (HHS-HCC)  Attention deficit hyperactivity disorder  (ADHD), predominantly hyperactive type  Depression affecting pregnancy (HHS-HCC)  Morbid obesity with BMI of 40.0-44.9, adult (CMS/HHS-HCC)  Nausea   Plan:   Pregnancy confirmed. Patient's last menstrual period was 09/07/2023 (exact date).  Firm LMP: She is [redacted]w[redacted]d based on LMP, consistent with  early ultrasound, with an EDD of  06/13/24 - Working EDD  Considerations for pregnancy: Morbid obesity Type 2 DM Depression/ADHD  Prenatal screening discussed:yes - Discussed options for genetic screening. Limitations of testing reviewed.   - Discussed cfDNA screen after 9 weeks to test for aneuploidy. Patient desires this screening.  Discussed CF and SMA carrier screening for individuals who have not completed this screening prior. She desires this option  this screening. - Discussed AFP/quad screen between 15 & 21 weeks. Patient desires this option  - Discussed the Anatomical Ultrasound to be done by 20 weeks here. -The patient was encouraged to get counseling with genetics.  Pap smear will perform at new OB appointment   Risk factors discussed.   Healthy dietary and lifestyle choices stressed Recommended weight gain discussed.   Exercise was encouraged.  Breastfeeding encouraged.  continue taking prenatal vitamins.  Medications reviewed for appropriateness during pregnancy.  Vaginal bleeding precautions and warning s/s reviewed  The patient was counseled on prenatal screening/testing, expectations for prenatal appointments and ultrasounds, the group practice setting with multiple ob providers who will be sharing in her prenatal care.  We discussed resources for childbirth and breastfeeding education at the [facility]. The [facility] O[facility]YN Prenatal Booklet was also reviewed with the patient.  CMP, urine PCR   No orders of the defined types were placed in this encounter.   Return in about 2 weeks (around 11/19/2023) for New OB.    Attestation Statement:   I personally performed  the service, non-incident to. (WP)   FELICIA L DICKERSON, CNM

## 2024-01-25 ENCOUNTER — Observation Stay
Admission: EM | Admit: 2024-01-25 | Discharge: 2024-01-25 | Disposition: A | Discharge status: Home / Self Care | Payer: MEDICAID | Admitting: Obstetrics and Gynecology

## 2024-01-25 ENCOUNTER — Observation Stay: Payer: MEDICAID

## 2024-01-25 ENCOUNTER — Other Ambulatory Visit: Payer: Self-pay

## 2024-01-25 ENCOUNTER — Encounter: Payer: Self-pay | Admitting: Obstetrics and Gynecology

## 2024-01-25 DIAGNOSIS — O24415 Gestational diabetes mellitus in pregnancy, controlled by oral hypoglycemic drugs: Secondary | ICD-10-CM | POA: Insufficient documentation

## 2024-01-25 DIAGNOSIS — Z3A20 20 weeks gestation of pregnancy: Secondary | ICD-10-CM | POA: Insufficient documentation

## 2024-01-25 DIAGNOSIS — Y92009 Unspecified place in unspecified non-institutional (private) residence as the place of occurrence of the external cause: Secondary | ICD-10-CM | POA: Insufficient documentation

## 2024-01-25 DIAGNOSIS — O26899 Other specified pregnancy related conditions, unspecified trimester: Secondary | ICD-10-CM | POA: Diagnosis present

## 2024-01-25 DIAGNOSIS — O24119 Pre-existing diabetes mellitus, type 2, in pregnancy, unspecified trimester: Secondary | ICD-10-CM | POA: Diagnosis present

## 2024-01-25 DIAGNOSIS — O24112 Pre-existing diabetes mellitus, type 2, in pregnancy, second trimester: Principal | ICD-10-CM | POA: Insufficient documentation

## 2024-01-25 DIAGNOSIS — Z794 Long term (current) use of insulin: Secondary | ICD-10-CM | POA: Insufficient documentation

## 2024-01-25 DIAGNOSIS — E11649 Type 2 diabetes mellitus with hypoglycemia without coma: Secondary | ICD-10-CM | POA: Diagnosis present

## 2024-01-25 DIAGNOSIS — W19XXXA Unspecified fall, initial encounter: Principal | ICD-10-CM | POA: Insufficient documentation

## 2024-01-25 DIAGNOSIS — Z7984 Long term (current) use of oral hypoglycemic drugs: Secondary | ICD-10-CM | POA: Insufficient documentation

## 2024-01-25 LAB — COMPREHENSIVE METABOLIC PANEL WITH GFR
ALT: 16 U/L (ref 0–44)
AST: 14 U/L — ABNORMAL LOW (ref 15–41)
Albumin: 3.6 g/dL (ref 3.5–5.0)
Alkaline Phosphatase: 49 U/L (ref 38–126)
Anion gap: 8 (ref 5–15)
BUN: 8 mg/dL (ref 6–20)
CO2: 23 mmol/L (ref 22–32)
Calcium: 8.9 mg/dL (ref 8.9–10.3)
Chloride: 105 mmol/L (ref 98–111)
Creatinine, Ser: 0.39 mg/dL — ABNORMAL LOW (ref 0.44–1.00)
GFR, Estimated: >60 mL/min (ref >60)
Glucose, Bld: 117 mg/dL — ABNORMAL HIGH (ref 70–99)
Potassium: 4.3 mmol/L (ref 3.5–5.1)
Sodium: 136 mmol/L (ref 135–145)
Total Bilirubin: 0.3 mg/dL (ref 0.0–1.2)
Total Protein: 6.9 g/dL (ref 6.5–8.1)

## 2024-01-25 LAB — CBC
HCT: 34.1 % — ABNORMAL LOW (ref 36.0–46.0)
Hemoglobin: 11.7 g/dL — ABNORMAL LOW (ref 12.0–15.0)
MCH: 29.6 pg (ref 26.0–34.0)
MCHC: 34.3 g/dL (ref 30.0–36.0)
MCV: 86.3 fL (ref 80.0–100.0)
Platelets: 308 K/uL (ref 150–400)
RBC: 3.95 MIL/uL (ref 3.87–5.11)
RDW: 12.2 % (ref 11.5–15.5)
WBC: 10.2 K/uL (ref 4.0–10.5)
nRBC: 0 % (ref 0.0–0.2)

## 2024-01-25 LAB — KLEIHAUER-BETKE STAIN
Fetal Cells %: 0 %
Quantitation Fetal Hemoglobin: 0 mL

## 2024-01-25 LAB — GLUCOSE, CAPILLARY: Glucose-Capillary: 77 mg/dL (ref 70–99)

## 2024-01-25 LAB — CBG MONITORING, ED: Glucose-Capillary: 110 mg/dL — ABNORMAL HIGH (ref 70–99)

## 2024-01-25 LAB — MAGNESIUM: Magnesium: 2 mg/dL (ref 1.7–2.4)

## 2024-01-25 MED ORDER — INSULIN NPH (HUMAN) (ISOPHANE) 100 UNIT/ML ~~LOC~~ SUSP: 15.0000 [IU] | Freq: Two times a day (BID) | SUBCUTANEOUS | 11 refills | Status: AC

## 2024-01-25 MED ORDER — INSULIN NPH (HUMAN) (ISOPHANE) 100 UNIT/ML ~~LOC~~ SUSP
15.0000 [IU] | Freq: Two times a day (BID) | SUBCUTANEOUS | Status: DC
  Filled 2024-01-25: qty 10

## 2024-01-25 MED ORDER — ACETAMINOPHEN 500 MG PO TABS
1000.0000 mg | ORAL_TABLET | Freq: Four times a day (QID) | ORAL | Status: DC | PRN
  Administered 2024-01-25: 1000 mg via ORAL
  Filled 2024-01-25: qty 2

## 2024-01-25 MED ORDER — INSULIN LISPRO 100 UNIT/ML IJ SOLN: INTRAMUSCULAR | 11 refills | Status: AC

## 2024-01-25 MED ORDER — INSULIN ASPART 100 UNIT/ML IJ SOLN: 0.0000 [IU] | Freq: Three times a day (TID) | INTRAMUSCULAR | Status: DC

## 2024-01-25 MED ORDER — CALCIUM CARBONATE ANTACID 500 MG PO CHEW: 2.0000 | CHEWABLE_TABLET | ORAL | Status: DC | PRN

## 2024-01-25 MED ORDER — INSULIN LISPRO 100 UNIT/ML IJ SOLN: 8.0000 [IU] | Freq: Two times a day (BID) | INTRAMUSCULAR | 11 refills | Status: AC

## 2024-01-25 NOTE — Inpatient Diabetes Management (Signed)
 Inpatient Diabetes Program Recommendations  ADA Standards of Care  Diabetes in Pregnancy Target Glucose Ranges:  Fasting: 70 - 95 mg/dL 1 hr postprandial:  889 - 140mg /dL (from first bite of meal) 2 hr postprandial:  100 - 120 mg/dL (from first bit of meal)    Lab Results  Component Value Date   GLUCAP 77 01/25/2024   HGBA1C 12.0 (H) 09/11/2022    Review of Glycemic Control  Diabetes history: DM2 Outpatient Diabetes medications: Metformin  prior to pregnancy Current orders for Inpatient glycemic control: NPH 15 units bid ac meals Novolog  0-14 units tid 2 hrs. postprandial  Inpatient Diabetes Program Recommendations:   Spoke with patient and husband @ bedside. Patient shared her husband normally gives her insulin  to her prior to him going to work and she tries to eat a little bit. This morning her CBG was 54, drank juice to increase, then her husband gave her the Novolog  14 units when she started to eat a biscuit. Patient has been eating less carbohydrate, less fat and less sugars since her last OB appt. This is when she started having hypoglycemia with dizziness and falling. Reviewed with patient normal levels of CBGs and importance of taking amount of insulin  based on what she is eating. Patient is willing to learn carb counting, reviewed hypoglycemia.  MD ordered application of Freestyle CGM at discharge for patient. Education done regarding application and changing CGM sensor (alternate every 15 days on back of arms), 1 hour warm-up, use of glucometer when alert displays, how to scan CGM for glucose reading and information for PCP. Patient has also been given educational packet regarding use CGM sensor including the 1-800 toll free number for any questions, problems or needs related to the [facility] sensors or reader.    Sensor applied by patient to (R) Arm at .  Explained that glucose readings will not be available until 1 hour after application. Reviewed use of CGM including how  to scan, changing Sensor, Vitamin C warning, arrows with glucose readings, and Freestyle app. Patient very appreciative.   Discussed insulin  dosing and use of CGM with Therisa Pillow and she is adjusting insulin  doses until patient goes to her MD appt. This Friday and ordering nutrition training on carbohydrate counting. Attached carbohydrate counting on exit care as well.  Thank you, Momodou Consiglio E. Meliss Fleek, RN, MSN, CNS, CDCES  Diabetes Coordinator Inpatient Glycemic Control Team Team Pager [phone] (8am-5pm) 01/25/2024 1:38 PM

## 2024-01-25 NOTE — Discharge Summary (Signed)
 Evans Evans is a 33 y.o. female. She is at [redacted]w[redacted]d gestation. Patient's last menstrual period was 07/06/2023 (approximate). 06/13/2024, by Patient Reported   Prenatal care site: [facility]  Chief complaint: fell at home   Admission Diagnoses:  1) intrauterine pregnancy at [redacted]w[redacted]d  2) Fall at home, initial encounter [W19.XXXA, Y92.009] 3) Hypoglycemia associated with diabetes  4) Headache in pregnancy   Discharge Diagnoses:  Principal Problem:   Fall at home, initial encounter Active Problems:   Hypoglycemia associated with diabetes (HCC)   Headache in pregnancy   Type 2 diabetes mellitus complicating pregnancy, antepartum   HPI: Evans Evans presents to L&D with complaints of a fall at home. She has Type 2 DM in pregnancy that is controlled with insulin . She reports a low this morning of 54. She drank orange juice and it increased to 84. An hour later her CBG was 106 and she took her morning dose of insulin  that included 20 units of NPH and 14 units of humalog aspart. She had a biscuit for breakfast. She had a syncopal episode 15 minutes after taking her insulin . She fell and hit her head on the counter and then landed on her abdomen. She reports frequent episodes of headaches and blurry vision since starting her insulin . States headaches can be severe enough to cause N/V. Also reports frequent episodes of low blood sugars.  Her CBG on arrival to ED was 110 with repeat of 77. Her pregnancy is complicated by type 2 diabetes mellitus.  She denies Contractions, Loss of fluid, or Vaginal bleeding. Endorses fetal movement as frequent flutters (appropriate for gestational age).   S: Resting comfortably. no CTX, no VB.no LOF,  Active fetal movement.   Maternal Medical History:  Past Medical Hx:  has a past medical history of Diabetes mellitus without complication (HCC) and Obesity.    Past Surgical Hx:  has a past surgical history that includes Cholecystectomy.   Allergies  Allergen  Reactions   Pravastatin Diarrhea     Prior to Admission medications   Medication Sig Start Date End Date Taking? Authorizing Provider  acetaminophen  (TYLENOL ) 325 MG tablet Take 2 tablets (650 mg total) by mouth every 4 (four) hours as needed (for pain scale < 4  OR  temperature  >/=  100.5 F). 02/19/20  Yes Vernel Therisa HERO, CNM  insulin  lispro (HUMALOG) 100 UNIT/ML injection 0-14 Units, Subcutaneous, 3 times daily after meals, First dose on Tue 01/25/24 at 1415, CBG < 70: Do not take insulin , CBG 121 - 160 (dose in units): 2; CBG 161 - 200 (dose in units): 3; CBG 201 - 240 (dose in units): 4, CBG 241 - 280 (dose in units): 6, CBG 281 - 320 (dose in units): 8, CBG 321 - 360 (dose in units): 10, CBG 361 - 400 (dose in units): 12, CBG > 400: 14 units and call physician 01/25/24  Yes Vernel Therisa HERO, CNM  magnesium oxide (MAG-OX) 400 (240 Mg) MG tablet Take 400 mg by mouth daily.   Yes [provider]  ondansetron  (ZOFRAN -ODT) 4 MG disintegrating tablet Take 1 tablet (4 mg total) by mouth every 8 (eight) hours as needed. 07/22/23  Yes White, Adrienne R, NP  Prenatal Vit-Fe Fumarate-FA (MULTIVITAMIN-PRENATAL) 27-0.8 MG TABS tablet Take 1 tablet by mouth daily at 12 noon.   Yes [provider]  pyridOXINE (VITAMIN B6) 50 MG tablet Take 50 mg by mouth in the morning and at bedtime.   Yes [provider]  albuterol  (VENTOLIN  HFA) 108 (90 Base) MCG/ACT inhaler Inhale 1-2 puffs into the lungs every 6 (six) hours as needed (cough). Patient not taking: Reported on 09/11/2022 11/04/20   Boddu, Kristin, FNP  cephALEXin  (KEFLEX ) 500 MG capsule Take 1 capsule (500 mg total) by mouth 2 (two) times daily. Patient not taking: Reported on 08/07/2022 05/09/22   Ward, Josette SAILOR, DO  clotrimazole  (GYNE-LOTRIMIN ) 1 % vaginal cream Place 1 Applicatorful vaginally at bedtime. Patient not taking: Reported on 08/07/2022 05/05/21   Blaise Aleene KIDD, MD  cyclobenzaprine  (FLEXERIL ) 10 MG tablet Take 1 tablet  (10 mg total) by mouth at bedtime. Patient not taking: Reported on 01/25/2024 08/18/23   Teresa Shelba SAUNDERS, NP  diphenoxylate -atropine  (LOMOTIL ) 2.5-0.025 MG tablet Take 1 tablet by mouth 4 (four) times daily as needed for diarrhea or loose stools. Patient not taking: Reported on 01/25/2024 07/22/23   Teresa Shelba SAUNDERS, NP  fluconazole  (DIFLUCAN ) 150 MG tablet Take 1 tablet (150 mg total) by mouth every three (3) days as needed for up to 2 doses. Patient not taking: Reported on 01/25/2024 07/30/23   Teresa Shelba SAUNDERS, NP  insulin  lispro (HUMALOG) 100 UNIT/ML injection Inject 0.08 mLs (8 Units total) into the skin in the morning and at bedtime. 01/25/24   Vernel Therisa HERO, CNM  insulin  NPH Human (NOVOLIN N) 100 UNIT/ML injection Inject 0.15 mLs (15 Units total) into the skin 2 (two) times daily before a meal. 01/25/24   Vernel Therisa HERO, CNM  metFORMIN  (GLUCOPHAGE ) 500 MG tablet Take 1 tablet (500 mg total) by mouth daily. Patient not taking: Reported on 01/25/2024 07/22/23   Teresa Shelba SAUNDERS, NP  metFORMIN  (GLUCOPHAGE -XR) 500 MG 24 hr tablet Take 1 tablet (500 mg total) by mouth daily with breakfast. 05/10/20 05/10/21  Myron Nest, CNM  metroNIDAZOLE  (FLAGYL ) 500 MG tablet Take 1 tablet (500 mg total) by mouth 2 (two) times daily. Patient not taking: Reported on 01/25/2024 07/22/23   Teresa Shelba SAUNDERS, NP  predniSONE  (STERAPRED UNI-PAK 21 TAB) 10 MG (21) TBPK tablet Take by mouth daily. Take 6 tabs by mouth daily  for 1 days, then 5 tabs for 1 days, then 4 tabs for 1 days, then 3 tabs for 1 days, 2 tabs for 1 days, then 1 tab by mouth daily for 1 days Patient not taking: Reported on 01/25/2024 08/18/23   Teresa Shelba SAUNDERS, NP    Social History: She  reports that she has quit smoking. She has never used smokeless tobacco. She reports that she does not drink alcohol and does not use drugs.  Family History: Family history is unknown by patient.   Review of Systems: A full review of systems was performed and  negative except as noted in the HPI.     Pertinent Results:   O:  BP 117/61 (BP Location: Right Arm)   Pulse 92   Temp 98.2 F (36.8 C) (Oral)   Resp 18   Ht 5' 3 (1.6 m)   Wt 110.7 kg   LMP 07/06/2023 (Approximate)   SpO2 96%   BMI 43.22 kg/m  Results for orders placed or performed during the hospital encounter of 01/25/24 (from the past 48 hours)  CBG monitoring, ED   Collection Time: 01/25/24  7:39 AM  Result Value Ref Range   Glucose-Capillary 110 (H) 70 - 99 mg/dL  Glucose, capillary   Collection Time: 01/25/24  8:27 AM  Result Value Ref Range   Glucose-Capillary 77 70 - 99 mg/dL  Comprehensive metabolic panel  Collection Time: 01/25/24  9:50 AM  Result Value Ref Range   Sodium 136 135 - 145 mmol/L   Potassium 4.3 3.5 - 5.1 mmol/L   Chloride 105 98 - 111 mmol/L   CO2 23 22 - 32 mmol/L   Glucose, Bld 117 (H) 70 - 99 mg/dL   BUN 8 6 - 20 mg/dL   Creatinine, Ser 9.60 (L) 0.44 - 1.00 mg/dL   Calcium  8.9 8.9 - 10.3 mg/dL   Total Protein 6.9 6.5 - 8.1 g/dL   Albumin 3.6 3.5 - 5.0 g/dL   AST 14 (L) 15 - 41 U/L   ALT 16 0 - 44 U/L   Alkaline Phosphatase 49 38 - 126 U/L   Total Bilirubin 0.3 0.0 - 1.2 mg/dL   GFR, Estimated >39 >39 mL/min   Anion gap 8 5 - 15  Magnesium   Collection Time: 01/25/24  9:50 AM  Result Value Ref Range   Magnesium 2.0 1.7 - 2.4 mg/dL  CBC   Collection Time: 01/25/24  9:50 AM  Result Value Ref Range   WBC 10.2 4.0 - 10.5 K/uL   RBC 3.95 3.87 - 5.11 MIL/uL   Hemoglobin 11.7 (L) 12.0 - 15.0 g/dL   HCT 65.8 (L) 63.9 - 53.9 %   MCV 86.3 80.0 - 100.0 fL   MCH 29.6 26.0 - 34.0 pg   MCHC 34.3 30.0 - 36.0 g/dL   RDW 87.7 88.4 - 84.4 %   Platelets 308 150 - 400 K/uL   nRBC 0.0 0.0 - 0.2 %  Kleihauer-Betke stain   Collection Time: 01/25/24  9:50 AM  Result Value Ref Range   Fetal Cells % 0 %   Quantitation Fetal Hemoglobin 0.0000 mL   # Vials RhIg NOT INDICATED     US  OB Limited Result Date: 01/25/2024 EXAM: ULTRASOUND OB LIMITED  TECHNIQUE: Transabdominal obstetric pelvic ultrasound was performed with color Doppler flow evaluation. COMPARISON: None available. CLINICAL HISTORY: 33 year old female, [redacted] weeks gestation, status post fall landing on abdomen. FINDINGS: FETUS: A single live intrauterine pregnancy is present Fetal movement is present. POSITION: Generally cephalic but variable presentation, head transverse to the left. HEART RATE: Fetal heart rate measures 143 beats per minute. PLACENTA: The placenta is located posteriorly. Negative for placenta previa. AMNIOTIC FLUID: The amniotic fluid volume is subjectively normal. BIOMETRICS: BPD measures 4.6 cm. estimated gestational age [redacted] weeks and 0 days. MATERNAL CERVIX: The cervix appears closed measuring 4.9 cm transabdominally (series 1 image 3). IMPRESSION: 1. Single live fetus estimated at 20 weeks 0 days by BPD. Electronically signed by: Helayne Hurst MD 01/25/2024 11:26 AM EST RP Workstation: [id number]   CT HEAD WO CONTRAST ( ) Result Date: 01/25/2024 EXAM: CT HEAD WITHOUT CONTRAST 01/25/2024 10:27:50 AM TECHNIQUE: CT of the head was performed without the administration of intravenous contrast. Automated exposure control, iterative reconstruction, and/or weight based adjustment of the mA/kV was utilized to reduce the radiation dose to as low as reasonably achievable. COMPARISON: Head CT 10/12/2014. CLINICAL HISTORY: 34 year old female with head trauma, minor, normal mental status. Fall with head trauma, 20 week IUP. FINDINGS: BRAIN AND VENTRICLES: No acute hemorrhage. No evidence of acute infarct. No hydrocephalus. No extra-axial collection. No mass effect or midline shift. Brain volume remains normal. Elnor white differentiation is stable and normal. No suspicious intracranial vascular hyperdensity. ORBITS: No abnormality. SINUSES: Visible paranasal sinuses, middle ears and mastoids are well aerated. SOFT TISSUES AND SKULL: No scalp soft tissue injury identified. No skull fracture.  IMPRESSION: 1. No  acute traumatic injury identified. 2.  Normal non contrast CT appearance of the brain. Electronically signed by: Helayne Hurst MD 01/25/2024 10:35 AM EST RP Workstation: [id number]    Constitutional: NAD, AAOx3  PULM: nl respiratory effort Abd: gravid, non-tender Ext: Non-tender, Nonedmeatous Psych: mood appropriate, speech normal Pelvic : deferred  Doppler: FHR 145 bpm, distinguished from maternal pulse  Toco: quiet   Consults: Inpatient diabetes management   Procedures: CT scan, Ultrasound, labs, Toco monitoring   Hospital Course: The patient was admitted to Labor and Delivery Triage for observation. A CT scan of the head returned negative results, and a limited obstetric ultrasound was normal. She did not experience contractions, cramping, loss of fluid, or bleeding. The tocodynamometer did not register any contractions for over four hours. Inpatient Diabetes Management was consulted to assist with the initiation of a continuous glucose monitor (CGM) and adjustments to insulin . 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 We discussed the significance of maintaining stable blood sugar levels, emphasizing that a CGM would provide better insights into her blood sugar trends. Syncopal episodes are likely linked to hypoglycemia and are occurring around the time of insulin  administration.  It was discussed with diabetes management that using meal coverage insulin  based on carbohydrate counts would likely be more advantageous for the patient compared to standard dosing or correctional insulin . NPH was reduced to 15 units twice daily, and Humalog was decreased to 8 units for breakfast and dinner. A sliding scale for two hours postprandial was ordered and reviewed with the patient and her spouse. A nutrition referral will be arranged to educate on carbohydrate counting during meals, followed by a transition to meal coverage instead of correctional  insulin . J. Hanks, RN, assisted the patient with the CGM setup, and samples were provided.  Referral to nutrition for carb counting placed through [facility] along with referral to neurology for headaches,  She was deemed stable for discharge and further outpatient management.   Discharge Condition: stable  Disposition: Discharge disposition: 01-Home or Self Care       Allergies as of 01/25/2024       Reactions   Pravastatin Diarrhea        Medication List     STOP taking these medications    albuterol  108 (90 Base) MCG/ACT inhaler Commonly known as: VENTOLIN  HFA   cephALEXin  500 MG capsule Commonly known as: KEFLEX    clotrimazole  1 % vaginal cream Commonly known as: GYNE-LOTRIMIN    cyclobenzaprine  10 MG tablet Commonly known as: FLEXERIL    diphenoxylate -atropine  2.5-0.025 MG tablet Commonly known as: Lomotil    fluconazole  150 MG tablet Commonly known as: DIFLUCAN    metFORMIN  500 MG 24 hr tablet Commonly known as: GLUCOPHAGE -XR   metFORMIN  500 MG tablet Commonly known as: GLUCOPHAGE    metroNIDAZOLE  500 MG tablet Commonly known as: FLAGYL    predniSONE  10 MG (21) Tbpk tablet Commonly known as: STERAPRED UNI-PAK 21 TAB       TAKE these medications    acetaminophen  325 MG tablet Commonly known as: TYLENOL  Take 2 tablets (650 mg total) by mouth every 4 (four) hours as needed (for pain scale < 4  OR  temperature  >/=  100.5 F).   insulin  lispro 100 UNIT/ML injection Commonly known as: HUMALOG Inject 0.08 mLs (8 Units total) into the skin in the morning and at bedtime. What changed: how much to take   insulin  lispro 100 UNIT/ML injection Commonly known as: HumaLOG 0-14 Units, Subcutaneous, 3  times daily after meals, First dose on Tue 01/25/24 at 1415, CBG < 70: Do not take insulin , CBG 121 - 160 (dose in units): 2; CBG 161 - 200 (dose in units): 3; CBG 201 - 240 (dose in units): 4, CBG 241 - 280 (dose in units): 6, CBG 281 - 320 (dose in units): 8,  CBG 321 - 360 (dose in units): 10, CBG 361 - 400 (dose in units): 12, CBG > 400: 14 units and call physician What changed: You were already taking a medication with the same name, and this prescription was added. Make sure you understand how and when to take each.   insulin  NPH Human 100 UNIT/ML injection Commonly known as: NOVOLIN N Inject 0.15 mLs (15 Units total) into the skin 2 (two) times daily before a meal. What changed: how much to take   magnesium oxide 400 (240 Mg) MG tablet Commonly known as: MAG-OX Take 400 mg by mouth daily.   multivitamin-prenatal 27-0.8 MG Tabs tablet Take 1 tablet by mouth daily at 12 noon.   ondansetron  4 MG disintegrating tablet Commonly known as: ZOFRAN -ODT Take 1 tablet (4 mg total) by mouth every 8 (eight) hours as needed.   pyridOXINE 50 MG tablet Commonly known as: VITAMIN B6 Take 50 mg by mouth in the morning and at bedtime.        Follow-up Information     [facility]. Go on 01/28/2024.   Why: routine prenatal appointment Contact information: [address] [place]  [phone]               ----- Therisa CHRISTELLA Pillow, CNM  Certified Nurse Midwife [facility]

## 2024-01-25 NOTE — ED Triage Notes (Signed)
 Pt had sudden onset of loss at 645 this morning and she fell forward onto hardwood floor. Pt is [redacted] weeks pregnant. G13 P1 A11. Pt reports blood sugar at 4am was 54. She drank juice and it improved to 84 at 5am. She checked it right before this episode and it was 106. Pt took 14 units of Lispro before this episode happened. Pt drank juice after getting up from the fall.    CBG 110 in triage

## 2024-01-25 NOTE — OB Triage Note (Addendum)
 Pt is a 33yo G13P1, 20w 0d type 2 diabetic who is insulin  controlled. She arrived to the unit with complaints of blurry vision and abd cramping after a fall. She states she fainted and hit her head on her kitchen counter and then fell flat on her belly on the floor at 0645. At 0700 she started feeling mild abd cramping. At 0400 the pt states she woke up not feeling well and took her CBG which was 54, after eating/drinking at 0515 she rechecked it and her CBG was 84. Pt states she took her insulin  at 0630 and her CBG was 106 at the time. after taking the insulin  is when the syncope episode occurred.  Pt states that she has experienced blurry vision since being prescribed the insulin  at least every other day and that the vision disturbance is for 38mins-3.5hrs  Pt states that she has had ongoing HAs for the last 15weeks that will induce vomiting at times when the pain is too intense. She states that tylenol  does not help. Denies vaginal bleeding/ctx. 0826 doppler 145 CBG upon pt arrival 77.

## 2024-01-25 NOTE — OB Triage Note (Signed)

## 2024-03-06 ENCOUNTER — Encounter: Payer: Self-pay | Admitting: Family Medicine

## 2024-03-07 ENCOUNTER — Telehealth: Payer: Self-pay | Admitting: Licensed Clinical Social Worker

## 2024-03-07 NOTE — Telephone Encounter (Signed)
-----   Message from Alan Hail sent at 03/06/2024 11:41 AM EST ----- LCSW notified by clerical that this patient was referred from [facility] for mental health services.

## 2024-03-23 ENCOUNTER — Other Ambulatory Visit: Payer: Self-pay | Admitting: Obstetrics

## 2024-03-23 DIAGNOSIS — Z3490 Encounter for supervision of normal pregnancy, unspecified, unspecified trimester: Secondary | ICD-10-CM

## 2024-03-23 NOTE — Progress Notes (Signed)
 [id number] at [redacted]w[redacted]d, LMP of 09/07/23, c/w early US  at [redacted]w[redacted]d.  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 [place], [place] [address]   Antibody screen neg  Rubella immune    Varicella Immune  RPR NR  HBsAg   NR  Hep C NR  HIV   NR  GC neg  Chlamydia neg  Genetic screening cfDNA negative  1 hour GTT N/a  3 hour GTT N/a  GBS   Pos   - Tdap vaccine: Given prenatally - Flu vaccine: Given prenatally -RSV vaccine:out of season  Contraception:Contraceptives: IUD Paragard Feeding preference: breast feeding  ____ Bobbette Brunswick, CNM Certified Nurse Midwife [facility]

## 2024-04-05 ENCOUNTER — Ambulatory Visit: Payer: Self-pay | Admitting: Licensed Clinical Social Worker
